# Patient Record
Sex: Male | Born: 1995 | Race: White | Hispanic: No | Marital: Single | Smoking: Former smoker
Health system: Southern US, Community
[De-identification: ages and names within clinical notes are randomized; demographics above are authoritative.]

## PROBLEM LIST (undated history)

## (undated) DIAGNOSIS — F329 Major depressive disorder, single episode, unspecified: Secondary | ICD-10-CM

## (undated) DIAGNOSIS — F32A Depression, unspecified: Secondary | ICD-10-CM

## (undated) DIAGNOSIS — K5792 Diverticulitis of intestine, part unspecified, without perforation or abscess without bleeding: Secondary | ICD-10-CM

## (undated) HISTORY — DX: Depression, unspecified: F32.A

## (undated) HISTORY — DX: Diverticulitis of intestine, part unspecified, without perforation or abscess without bleeding: K57.92

## (undated) HISTORY — DX: Major depressive disorder, single episode, unspecified: F32.9

---
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
## 2016-03-18 ENCOUNTER — Ambulatory Visit: Payer: Medicaid Other | Admitting: Primary Care

## 2016-03-20 ENCOUNTER — Ambulatory Visit: Payer: Self-pay | Admitting: Primary Care

## 2017-03-22 ENCOUNTER — Other Ambulatory Visit: Payer: Self-pay | Admitting: Student

## 2017-03-22 DIAGNOSIS — R1033 Periumbilical pain: Secondary | ICD-10-CM

## 2017-03-24 ENCOUNTER — Ambulatory Visit
Admission: RE | Admit: 2017-03-24 | Discharge: 2017-03-24 | Disposition: A | Discharge status: Home / Self Care | Payer: Managed Care, Other (non HMO) | Source: Ambulatory Visit | Attending: Student | Admitting: Student

## 2017-03-24 DIAGNOSIS — R1033 Periumbilical pain: Secondary | ICD-10-CM

## 2017-03-24 MED ORDER — IOPAMIDOL (ISOVUE-300) INJECTION 61%
100.0000 mL | Freq: Once | INTRAVENOUS | Status: AC | PRN
  Administered 2017-03-24: 100 mL via INTRAVENOUS

## 2018-01-18 ENCOUNTER — Telehealth: Payer: Self-pay | Admitting: General Practice

## 2018-01-18 NOTE — Telephone Encounter (Signed)
Tried calling mom number no answer.  Tried call pt number no voice mail.    What time of insurance does pt have??

## 2018-01-18 NOTE — Telephone Encounter (Signed)
Noted  

## 2018-01-18 NOTE — Telephone Encounter (Signed)
Appointment 8/28  Offered 8/15 appointment mom declined conflict with daughter dentist appintment

## 2018-01-18 NOTE — Telephone Encounter (Signed)
See below CRM Ok to schedule sooner than 9/10  Copied from CRM [id number]. Topic: Appointment Scheduling - Scheduling Inquiry for Clinic >> Jan 18, 2018  8:05 AM [name], [name] wrote: Reason for CRM: mom requesting to see if Tim Ortiz would work pt in asap for a new pt appt? Pt is struggling with depression and the first available is 9/10. Mom wants sooner than that if possible.  Mom sees Tim Ortiz as well.

## 2018-01-18 NOTE — Telephone Encounter (Signed)
We can certainly fit patient in. Can you schedule him at an appointment time that is not back to back with another new patient, hospital follow up, or physical? Any day will work.

## 2018-01-27 ENCOUNTER — Encounter: Payer: Self-pay | Admitting: Primary Care

## 2018-01-27 ENCOUNTER — Ambulatory Visit: Payer: Managed Care, Other (non HMO) | Admitting: Primary Care

## 2018-01-27 VITALS — BP 158/100 | HR 91 | Temp 98.0°F | Ht 67.0 in | Wt 218.5 lb

## 2018-01-27 DIAGNOSIS — F329 Major depressive disorder, single episode, unspecified: Secondary | ICD-10-CM | POA: Insufficient documentation

## 2018-01-27 DIAGNOSIS — F419 Anxiety disorder, unspecified: Secondary | ICD-10-CM | POA: Insufficient documentation

## 2018-01-27 DIAGNOSIS — F411 Generalized anxiety disorder: Secondary | ICD-10-CM | POA: Insufficient documentation

## 2018-01-27 MED ORDER — FLUOXETINE HCL 10 MG PO CAPS: 10.0000 mg | ORAL_CAPSULE | Freq: Every day | ORAL | 1 refills | Status: DC

## 2018-01-27 NOTE — Progress Notes (Signed)
Subjective:    Patient ID: Tim Ortiz, male    DOB: 06-26-1995, 22 y.o.   MRN: [id number]  HPI  Tim Ortiz is a 22 year old male who presents today to establish care and discuss the problems mentioned below. Will obtain old records.  1) Depression: His mother has noticed a change in his mood and thinks he's depressed. He feels little motivation to do anything, doesn't tend to feel any emotion, feels fatigued. Symptoms have been present for the past 2-3 years since the death of his grandmother. He did take a year off from school 2 years ago, quit his job in May this year. He is going back to school this year and is looking forward to this. Overall he feels his symptoms are getting worse. PHQ 9 score 17 and GAD 7 of 10 today. He denies SI/HI  2) Elevated Blood Pressure Reading: On exam today. He denies a prior history of elevated blood pressure readings. He doesn't feel nervous during his exam today. He denies chest pain, headaches, dizziness.   BP Readings from Last 3 Encounters:  01/27/18 (!) 158/100     Review of Systems  Respiratory: Negative for shortness of breath.   Cardiovascular: Negative for chest pain.  Neurological: Negative for dizziness and headaches.  Psychiatric/Behavioral:       See HPI       Past Medical History:  Diagnosis Date  . Depression   . Diverticulitis      Social History   Socioeconomic History  . Marital status: Single    Spouse name: Not on file  . Number of children: Not on file  . Years of education: Not on file  . Highest education level: Not on file  Occupational History  . Not on file  Social Needs  . Financial resource strain: Not on file  . Food insecurity:    Worry: Not on file    Inability: Not on file  . Transportation needs:    Medical: Not on file    Non-medical: Not on file  Tobacco Use  . Smoking status: Former Games developermoker  . Smokeless tobacco: Never Used  Substance and Sexual Activity  . Alcohol use: Yes  . Drug use:  Not on file  . Sexual activity: Not on file  Lifestyle  . Physical activity:    Days per week: Not on file    Minutes per session: Not on file  . Stress: Not on file  Relationships  . Social connections:    Talks on phone: Not on file    Gets together: Not on file    Attends religious service: Not on file    Active member of club or organization: Not on file    Attends meetings of clubs or organizations: Not on file    Relationship status: Not on file  . Intimate partner violence:    Fear of current or ex partner: Not on file    Emotionally abused: Not on file    Physically abused: Not on file    Forced sexual activity: Not on file  Other Topics Concern  . Not on file  Social History Narrative   Single.    No children.   [employer] for [employer].   Enjoys playing video games, spending time with friends.     History reviewed. No pertinent surgical history.  Family History  Problem Relation Age of Onset  . Depression Mother     No Known Allergies  No current outpatient medications  on file prior to visit.   No current facility-administered medications on file prior to visit.     BP (!) 158/100   Pulse 91   Temp 98 F (36.7 C) (Oral)   Ht 5\' 7"  (1.702 m)   Wt 218 lb 8 oz (99.1 kg)   SpO2 97%   BMI 34.22 kg/m    Objective:   Physical Exam  Constitutional: He appears well-nourished.  Cardiovascular: Normal rate and regular rhythm.  Respiratory: Effort normal and breath sounds normal.  Skin: Skin is warm and dry.  Psychiatric: He has a normal mood and affect.           Assessment & Plan:

## 2018-01-27 NOTE — Patient Instructions (Signed)
Start fluoxetine 10 mg capsules once daily for anxiety and depression.  Schedule a follow up visit in 6 weeks for re-evaluation of symptoms.  It was a pleasure to meet you today! Please don't hesitate to call or message me with any questions. Welcome to [facility]!

## 2018-01-27 NOTE — Assessment & Plan Note (Signed)
Mostly symptoms of depression over the last 2-3 years. PHQ 9 score of 17 and GAD 7 score of 10 today. Denies SI/HI.  Discussed treatment options including therapy vs medication, he kindly declines therapy and opts for medication.   We discussed possible side effects of headache, GI upset, drowsiness, and SI/HI. If thoughts of SI/HI develop, we discussed to present to the emergency immediately. Patient verbalized understanding.   Follow up in 6 weeks for re-evaluation.

## 2018-02-09 ENCOUNTER — Ambulatory Visit: Payer: Managed Care, Other (non HMO) | Admitting: Primary Care

## 2018-03-10 ENCOUNTER — Ambulatory Visit: Payer: Managed Care, Other (non HMO) | Admitting: Primary Care

## 2018-03-10 ENCOUNTER — Encounter: Payer: Self-pay | Admitting: Primary Care

## 2018-03-10 DIAGNOSIS — F419 Anxiety disorder, unspecified: Secondary | ICD-10-CM | POA: Diagnosis not present

## 2018-03-10 DIAGNOSIS — R030 Elevated blood-pressure reading, without diagnosis of hypertension: Secondary | ICD-10-CM | POA: Diagnosis not present

## 2018-03-10 DIAGNOSIS — F329 Major depressive disorder, single episode, unspecified: Secondary | ICD-10-CM | POA: Diagnosis not present

## 2018-03-10 MED ORDER — FLUOXETINE HCL 10 MG PO CAPS: 10.0000 mg | ORAL_CAPSULE | Freq: Every day | ORAL | 2 refills | Status: DC

## 2018-03-10 NOTE — Assessment & Plan Note (Signed)
Improved on fluoxetine 10 mg. PHQ 9 score of 8 today. Denies SI/HI.  Continue same. Refills sent to pharmacy.

## 2018-03-10 NOTE — Assessment & Plan Note (Addendum)
Above goal today, also on last visit but is improved. Will have him continue to work on regular exercise and work on diet. BP improved on recheck to 136/96.  Will have him start monitoring BP at home and report readings at or above 135/90 after a few months of dietary and lifestyle changes. Continue to monitor.

## 2018-03-10 NOTE — Progress Notes (Signed)
Subjective:    Patient ID: Tim Ortiz, male    DOB: 10/19/1995, 22 y.o.   MRN: [id number]  HPI  Tim Ortiz is a 22 year old male who presents today for follow up of anxiety and depression.  He was last evaluated in mid August 2019 as a new patient with a 2-3 year history of symptoms of mood changes, little motivation to do anything, not feeling emotion, fatigue. PHQ 9 score of 17 and GAD 7 score of 10 that day. He had never been treated so he opted for medication and was initiated on Fluoxetine 10 mg.   Since his last visit he's noticed improvement. Positive effects include more motivation to do things, he's started running, decreased fatigue, more patience with his younger sister. He denies nausea, GI upset, headaches, SI/HI. PHQ 9 score of 8. He does still have difficulty falling asleep and is taking Melatonin.   2) Elevated Blood Pressure: Above goal today, also on prior visit. He does have a family history of hypertension. He is now feeling more motivated to exercise and improve his diet. He does not check his BP at home. He denies dizziness, chest pain, shortness of breath.   BP Readings from Last 3 Encounters:  03/10/18 (!) 144/100  01/27/18 (!) 158/100     Review of Systems  Eyes: Negative for visual disturbance.  Respiratory: Negative for shortness of breath.   Cardiovascular: Negative for chest pain.  Neurological: Negative for dizziness and headaches.  Psychiatric/Behavioral: Negative for suicidal ideas.       See HPI       Past Medical History:  Diagnosis Date  . Depression   . Diverticulitis      Social History   Socioeconomic History  . Marital status: Single    Spouse name: Not on file  . Number of children: Not on file  . Years of education: Not on file  . Highest education level: Not on file  Occupational History  . Not on file  Social Needs  . Financial resource strain: Not on file  . Food insecurity:    Worry: Not on file    Inability: Not on  file  . Transportation needs:    Medical: Not on file    Non-medical: Not on file  Tobacco Use  . Smoking status: Former Games developer  . Smokeless tobacco: Never Used  Substance and Sexual Activity  . Alcohol use: Yes  . Drug use: Not on file  . Sexual activity: Not on file  Lifestyle  . Physical activity:    Days per week: Not on file    Minutes per session: Not on file  . Stress: Not on file  Relationships  . Social connections:    Talks on phone: Not on file    Gets together: Not on file    Attends religious service: Not on file    Active member of club or organization: Not on file    Attends meetings of clubs or organizations: Not on file    Relationship status: Not on file  . Intimate partner violence:    Fear of current or ex partner: Not on file    Emotionally abused: Not on file    Physically abused: Not on file    Forced sexual activity: Not on file  Other Topics Concern  . Not on file  Social History Narrative   Single.    No children.   [employer].   Enjoys playing video games, spending  time with friends.     No past surgical history on file.  Family History  Problem Relation Age of Onset  . Depression Mother     No Known Allergies  Current Outpatient Medications on File Prior to Visit  Medication Sig Dispense Refill  . FLUoxetine (PROZAC) 10 MG capsule Take 1 capsule (10 mg total) by mouth daily. 30 capsule 1   No current facility-administered medications on file prior to visit.     BP (!) 144/100   Pulse 67   Temp 98.4 F (36.9 C) (Oral)   Ht 5\' 7"  (1.702 m)   Wt 210 lb (95.3 kg)   SpO2 98%   BMI 32.89 kg/m    Objective:   Physical Exam  Constitutional: He appears well-nourished.  Neck: Neck supple.  Cardiovascular: Normal rate and regular rhythm.  Respiratory: Effort normal and breath sounds normal.  Skin: Skin is warm and dry.  Psychiatric: He has a normal mood and affect.           Assessment & Plan:

## 2018-03-10 NOTE — Patient Instructions (Signed)
Continue fluoxetine 10 mg daily for anxiety and depression.  Start monitoring your blood pressure 1-2 times weekly, around the same time of day, for the next several months.  Ensure that you have rested for 30 minutes prior to checking your blood pressure. Record your readings and notify me of consistent readings at or above 135/90.  It was a pleasure to see you today!

## 2018-06-30 ENCOUNTER — Encounter: Payer: Self-pay | Admitting: Primary Care

## 2018-06-30 ENCOUNTER — Telehealth: Payer: Self-pay | Admitting: Primary Care

## 2018-06-30 ENCOUNTER — Ambulatory Visit: Payer: Managed Care, Other (non HMO) | Admitting: Primary Care

## 2018-06-30 VITALS — BP 122/78 | HR 77 | Temp 98.0°F | Ht 67.0 in | Wt 200.2 lb

## 2018-06-30 DIAGNOSIS — F419 Anxiety disorder, unspecified: Secondary | ICD-10-CM | POA: Diagnosis not present

## 2018-06-30 DIAGNOSIS — F329 Major depressive disorder, single episode, unspecified: Secondary | ICD-10-CM | POA: Diagnosis not present

## 2018-06-30 MED ORDER — FLUOXETINE HCL 20 MG PO TABS: 20.0000 mg | ORAL_TABLET | Freq: Every day | ORAL | 1 refills | Status: DC

## 2018-06-30 NOTE — Telephone Encounter (Signed)
Need to verify dosage of Fluoxetine 20mg . Please call.

## 2018-06-30 NOTE — Patient Instructions (Signed)
We've increased the dose of your fluoxetine to 20 mg. You may take two of the fluoxetine 10 mg tablets to equal 20 mg until your bottle is empty.  Schedule a follow up visit for 6 weeks for re-evaluation of depression.  It was a pleasure to see you today!

## 2018-06-30 NOTE — Assessment & Plan Note (Signed)
PHQ 9 score of 12 today, does seem as though his fluoxetine isn't as effective. Given that he's had some improvement we will increase his dose to 20 mg. Denies SI/HI. Follow up in 6 weeks for re-evaluation.

## 2018-06-30 NOTE — Progress Notes (Signed)
Subjective:    Patient ID: Tim Ortiz, male    DOB: 12-23-1995, 23 y.o.   MRN: [id number]  HPI  Tim Ortiz is a 23 year old male with a history of anxiety and depression who presents today to discuss medication treatment.  He is currently managed on fluoxetine 10 mg which was initiated in mid August 2019. GAD 7 score of 10 and PHQ 9 score of 17 that day. He returned for a follow up visit in late September and endorsed improvement so his medication was continued.  Since his last visit he feels like it "doesn't work anymore". Symptoms include feeling "numb". He does look forward to seeing friends. Little motivation to do anything, feels as though life is boring. He denies SI/HI.   BP Readings from Last 3 Encounters:  06/30/18 122/78  03/10/18 (!) 136/96  01/27/18 (!) 158/100     Review of Systems  Respiratory: Negative for shortness of breath.   Cardiovascular: Negative for chest pain.  Psychiatric/Behavioral: Positive for sleep disturbance. Negative for suicidal ideas. The patient is not nervous/anxious.        See HPI       Past Medical History:  Diagnosis Date  . Depression   . Diverticulitis      Social History   Socioeconomic History  . Marital status: Single    Spouse name: Not on file  . Number of children: Not on file  . Years of education: Not on file  . Highest education level: Not on file  Occupational History  . Not on file  Social Needs  . Financial resource strain: Not on file  . Food insecurity:    Worry: Not on file    Inability: Not on file  . Transportation needs:    Medical: Not on file    Non-medical: Not on file  Tobacco Use  . Smoking status: Former Games developer  . Smokeless tobacco: Never Used  Substance and Sexual Activity  . Alcohol use: Yes  . Drug use: Not on file  . Sexual activity: Not on file  Lifestyle  . Physical activity:    Days per week: Not on file    Minutes per session: Not on file  . Stress: Not on file  Relationships   . Social connections:    Talks on phone: Not on file    Gets together: Not on file    Attends religious service: Not on file    Active member of club or organization: Not on file    Attends meetings of clubs or organizations: Not on file    Relationship status: Not on file  . Intimate partner violence:    Fear of current or ex partner: Not on file    Emotionally abused: Not on file    Physically abused: Not on file    Forced sexual activity: Not on file  Other Topics Concern  . Not on file  Social History Narrative   Single.    No children.   [employer].   Enjoys playing video games, spending time with friends.     No past surgical history on file.  Family History  Problem Relation Age of Onset  . Depression Mother     No Known Allergies  No current outpatient medications on file prior to visit.   No current facility-administered medications on file prior to visit.     BP 122/78 (BP Location: Left Arm, Patient Position: Sitting, Cuff Size: Normal)   Pulse 77  Temp 98 F (36.7 C) (Oral)   Ht 5\' 7"  (1.702 m)   Wt 200 lb 4 oz (90.8 kg)   SpO2 98%   BMI 31.36 kg/m    Objective:   Physical Exam  Constitutional: He appears well-nourished.  Cardiovascular: Normal rate and regular rhythm.  Respiratory: Effort normal and breath sounds normal.  Skin: Skin is warm and dry.  Psychiatric: He has a normal mood and affect.  Appears depressed, overall good eye contact. Not wanting to contribute much to HPI           Assessment & Plan:

## 2018-06-30 NOTE — Telephone Encounter (Signed)
Verified the dose change with pharmacist.

## 2018-08-11 ENCOUNTER — Ambulatory Visit: Payer: Managed Care, Other (non HMO) | Admitting: Primary Care

## 2018-08-11 ENCOUNTER — Encounter: Payer: Self-pay | Admitting: Primary Care

## 2018-08-11 DIAGNOSIS — F329 Major depressive disorder, single episode, unspecified: Secondary | ICD-10-CM | POA: Diagnosis not present

## 2018-08-11 DIAGNOSIS — F419 Anxiety disorder, unspecified: Secondary | ICD-10-CM

## 2018-08-11 MED ORDER — FLUOXETINE HCL 40 MG PO CAPS: 40.0000 mg | ORAL_CAPSULE | Freq: Every day | ORAL | 0 refills | Status: DC

## 2018-08-11 NOTE — Progress Notes (Signed)
Subjective:    Patient ID: Tim Ortiz, male    DOB: 10-Apr-1996, 23 y.o.   MRN: [id number]  HPI  Tim Ortiz is a 23 year old male who presents today for follow up of anxiety and depression.  He was last evaluated on 06/30/18 with reports of return of depression with symptoms of feeling "numb", little motivation to do anything, not wanting to spend time with friends. He was managed on fluoxetine which was initiated in August 2019, felt like it was no longer effective. During his last visit PHQ 9 score of 12, fluoxetine increased to 20 mg.   Since his last visit he's feeling slightly improved, some good days. He doesn't dread the next day as much as he once did. He can't pinpoint positive effects but has noticed improvement. He is not seeing therapy as he has declined on prior visits. He denies SI/HI, GI upset.   Review of Systems  Cardiovascular: Negative for chest pain.  Gastrointestinal: Negative for nausea.  Psychiatric/Behavioral: Negative for suicidal ideas.       See HPI       Past Medical History:  Diagnosis Date  . Depression   . Diverticulitis      Social History   Socioeconomic History  . Marital status: Single    Spouse name: Not on file  . Number of children: Not on file  . Years of education: Not on file  . Highest education level: Not on file  Occupational History  . Not on file  Social Needs  . Financial resource strain: Not on file  . Food insecurity:    Worry: Not on file    Inability: Not on file  . Transportation needs:    Medical: Not on file    Non-medical: Not on file  Tobacco Use  . Smoking status: Former Games developer  . Smokeless tobacco: Never Used  Substance and Sexual Activity  . Alcohol use: Yes  . Drug use: Not on file  . Sexual activity: Not on file  Lifestyle  . Physical activity:    Days per week: Not on file    Minutes per session: Not on file  . Stress: Not on file  Relationships  . Social connections:    Talks on phone: Not on  file    Gets together: Not on file    Attends religious service: Not on file    Active member of club or organization: Not on file    Attends meetings of clubs or organizations: Not on file    Relationship status: Not on file  . Intimate partner violence:    Fear of current or ex partner: Not on file    Emotionally abused: Not on file    Physically abused: Not on file    Forced sexual activity: Not on file  Other Topics Concern  . Not on file  Social History Narrative   Single.    No children.   [employer].   Enjoys playing video games, spending time with friends.     No past surgical history on file.  Family History  Problem Relation Age of Onset  . Depression Mother     No Known Allergies  Current Outpatient Medications on File Prior to Visit  Medication Sig Dispense Refill  . FLUoxetine (PROZAC) 20 MG tablet Take 1 tablet (20 mg total) by mouth daily. For depression. 90 tablet 1   No current facility-administered medications on file prior to visit.  BP (!) 136/92   Pulse 74   Temp 98.1 F (36.7 C) (Oral)   Ht 5\' 7"  (1.702 m)   Wt 197 lb (89.4 kg)   SpO2 98%   BMI 30.85 kg/m    Objective:   Physical Exam  Constitutional: He appears well-nourished.  Neck: Neck supple.  Cardiovascular: Normal rate and regular rhythm.  Respiratory: Effort normal and breath sounds normal.  Skin: Skin is warm and dry.  Psychiatric: He has a normal mood and affect.           Assessment & Plan:

## 2018-08-11 NOTE — Assessment & Plan Note (Signed)
Seems improved when compared to prior visit, not at goal. Increase fluoxetine to 40 mg daily.  Also strongly recommended therapy for which he continues to decline. He will think about this and update.  We will plan to see him back in 4-6 weeks for follow up.

## 2018-08-11 NOTE — Patient Instructions (Signed)
We've increased the dose of your fluoxetine to 40 mg. You may take two of the 20 mg tablets until your bottle is empty.  Please consider seeing the counselor as discussed.   Schedule a follow up visit for 6 weeks for re-evaluation.  It was a pleasure to see you today!

## 2018-09-27 ENCOUNTER — Telehealth: Payer: Self-pay | Admitting: Primary Care

## 2018-09-27 NOTE — Telephone Encounter (Signed)
Called to see if patient has capability to do virtual visit on 4/16 instead of coming into office. No answer and no voicemail set up.

## 2018-09-29 ENCOUNTER — Ambulatory Visit: Payer: Managed Care, Other (non HMO) | Admitting: Primary Care

## 2018-10-14 ENCOUNTER — Telehealth: Payer: Self-pay | Admitting: Primary Care

## 2018-10-14 NOTE — Telephone Encounter (Signed)
Patient due for follow up visit for anxiety and depression, please schedule virtual visit.

## 2018-10-14 NOTE — Telephone Encounter (Signed)
Called to schedule. No answer and no voicemail set up.

## 2018-11-16 ENCOUNTER — Encounter: Payer: Self-pay | Admitting: Primary Care

## 2018-11-16 ENCOUNTER — Ambulatory Visit (INDEPENDENT_AMBULATORY_CARE_PROVIDER_SITE_OTHER): Payer: Managed Care, Other (non HMO) | Admitting: Primary Care

## 2018-11-16 VITALS — Wt 197.0 lb

## 2018-11-16 DIAGNOSIS — F329 Major depressive disorder, single episode, unspecified: Secondary | ICD-10-CM

## 2018-11-16 DIAGNOSIS — F419 Anxiety disorder, unspecified: Secondary | ICD-10-CM

## 2018-11-16 DIAGNOSIS — F32A Depression, unspecified: Secondary | ICD-10-CM

## 2018-11-16 MED ORDER — ESCITALOPRAM OXALATE 10 MG PO TABS: 10.0000 mg | ORAL_TABLET | Freq: Every day | ORAL | 1 refills | Status: DC

## 2018-11-16 NOTE — Assessment & Plan Note (Signed)
No real improvement on increased dose of fluoxetine 40 mg, overall does feel somewhat better but he may do better on another treatment regimen. Will have him switch from fluoxetine to Lexapro 10 mg.  Discussed instructions for switching. We will plan to see him back in 4-6 weeks for follow up. He will call sooner if he has any problems.

## 2018-11-16 NOTE — Patient Instructions (Signed)
  Switch from fluoxetine to escitalopram (Lexapro) 10 mg tablets for anxiety and depression.  Schedule a follow up visit for 4-6 weeks. Call me sooner if you have any problems.  It was a pleasure to see you today! [name], NP-C

## 2018-11-16 NOTE — Progress Notes (Signed)
Subjective:    Patient ID: Tim Ortiz, male    DOB: 1995/11/25, 23 y.o.   MRN: [id number]  HPI  Virtual Visit via Video Note  I connected with [name] on 11/16/18 at  2:40 PM EDT by a video enabled telemedicine application and verified that I am speaking with the correct person using two identifiers.  Location: Patient: Home Provider: Office   I discussed the limitations of evaluation and management by telemedicine and the availability of in person appointments. The patient expressed understanding and agreed to proceed.  History of Present Illness:  [name] is a 23 year old male who presents today for follow up of depression and anxiety.   He was last evaluated on 08/11/18 for same. During that visit his fluoxetine was increased from 20 mg to 40 mg as he had noticed some improvement overall.   Since his last visit he's feeling slightly better as he has been getting out of his house and is exercising. He hasn't noticed a difference since we increased the dose of fluoxetine to 40 mg. He thinks he's feeling slightly better as he's getting out a little more, not because of the dose increase. He's not tried any other medication for his symptoms. Denies SI/HI.    Observations/Objective:  Alert and oriented. Appears well, not sickly. No distress. Speaking in complete sentences.   Assessment and Plan:  No real improvement on increased dose of fluoxetine 40 mg, overall does feel somewhat better but he may do better on another treatment regimen. Will have him switch from fluoxetine to Lexapro 10 mg.  Discussed instructions for switching. We will plan to see him back in 4-6 weeks for follow up. He will call sooner if he has any problems.   Follow Up Instructions:  Switch from fluoxetine to escitalopram (Lexapro) 10 mg tablets for anxiety and depression.  Schedule a follow up visit for 4-6 weeks. Call me sooner if you have any problems.  It was a pleasure to see you  today! [name], NP-C    I discussed the assessment and treatment plan with the patient. The patient was provided an opportunity to ask questions and all were answered. The patient agreed with the plan and demonstrated an understanding of the instructions.   The patient was advised to call back or seek an in-person evaluation if the symptoms worsen or if the condition fails to improve as anticipated.     [name], NP    Review of Systems  Respiratory: Negative for shortness of breath.   Cardiovascular: Negative for chest pain.  Psychiatric/Behavioral:       See HPI       Past Medical History:  Diagnosis Date  . Depression   . Diverticulitis      Social History   Socioeconomic History  . Marital status: Single    Spouse name: Not on file  . Number of children: Not on file  . Years of education: Not on file  . Highest education level: Not on file  Occupational History  . Not on file  Social Needs  . Financial resource strain: Not on file  . Food insecurity:    Worry: Not on file    Inability: Not on file  . Transportation needs:    Medical: Not on file    Non-medical: Not on file  Tobacco Use  . Smoking status: Former Games developer  . Smokeless tobacco: Never Used  Substance and Sexual Activity  . Alcohol  use: Yes  . Drug use: Not on file  . Sexual activity: Not on file  Lifestyle  . Physical activity:    Days per week: Not on file    Minutes per session: Not on file  . Stress: Not on file  Relationships  . Social connections:    Talks on phone: Not on file    Gets together: Not on file    Attends religious service: Not on file    Active member of club or organization: Not on file    Attends meetings of clubs or organizations: Not on file    Relationship status: Not on file  . Intimate partner violence:    Fear of current or ex partner: Not on file    Emotionally abused: Not on file    Physically abused: Not on file    Forced sexual activity:  Not on file  Other Topics Concern  . Not on file  Social History Narrative   Single.    No children.   [employer] for [employer].   Enjoys playing video games, spending time with friends.     No past surgical history on file.  Family History  Problem Relation Age of Onset  . Depression Mother     No Known Allergies  Current Outpatient Medications on File Prior to Visit  Medication Sig Dispense Refill  . FLUoxetine (PROZAC) 40 MG capsule Take 1 capsule (40 mg total) by mouth daily. For depression and anxiety. (Patient not taking: Reported on 11/16/2018) 90 capsule 0   No current facility-administered medications on file prior to visit.     Wt 197 lb (89.4 kg)   BMI 30.85 kg/m    Objective:   Physical Exam  Constitutional: He is oriented to person, place, and time. He appears well-nourished.  Respiratory: Effort normal.  Neurological: He is alert and oriented to person, place, and time.  Psychiatric: He has a normal mood and affect.           Assessment & Plan:

## 2018-12-15 ENCOUNTER — Other Ambulatory Visit: Payer: Self-pay | Admitting: Primary Care

## 2018-12-15 DIAGNOSIS — F329 Major depressive disorder, single episode, unspecified: Secondary | ICD-10-CM

## 2018-12-15 DIAGNOSIS — F419 Anxiety disorder, unspecified: Secondary | ICD-10-CM

## 2019-03-15 ENCOUNTER — Other Ambulatory Visit: Payer: Self-pay | Admitting: Primary Care

## 2019-03-15 DIAGNOSIS — F329 Major depressive disorder, single episode, unspecified: Secondary | ICD-10-CM

## 2019-03-15 DIAGNOSIS — F419 Anxiety disorder, unspecified: Secondary | ICD-10-CM

## 2020-06-27 ENCOUNTER — Ambulatory Visit (HOSPITAL_COMMUNITY)
Admission: EM | Admit: 2020-06-27 | Discharge: 2020-06-27 | Disposition: A | Discharge status: Home / Self Care | Payer: 59

## 2020-06-27 ENCOUNTER — Encounter (HOSPITAL_COMMUNITY): Payer: Self-pay | Admitting: Emergency Medicine

## 2020-06-27 ENCOUNTER — Other Ambulatory Visit: Payer: Self-pay

## 2020-06-27 DIAGNOSIS — F32A Depression, unspecified: Secondary | ICD-10-CM | POA: Diagnosis not present

## 2020-06-27 DIAGNOSIS — F419 Anxiety disorder, unspecified: Secondary | ICD-10-CM | POA: Diagnosis not present

## 2020-06-27 DIAGNOSIS — F411 Generalized anxiety disorder: Secondary | ICD-10-CM

## 2020-06-27 DIAGNOSIS — F339 Major depressive disorder, recurrent, unspecified: Secondary | ICD-10-CM | POA: Insufficient documentation

## 2020-06-27 NOTE — ED Provider Notes (Signed)
Behavioral Health Urgent Care Medical Screening Exam  Patient Name: Tim Ortiz MRN: [id number] Date of Evaluation: 06/27/20 Chief Complaint: Chief Complaint/Presenting Problem: Tim Ortiz is a 25 yo male presenting as a walk in to [facility] for evaluation of depression symptoms.  Pt reported that he has SI thoughts initially on intake, but denied when asked directly. Pt reports that he has been treated for depression in the past, and has tried two different medications, but was not consistent with them. Pt was recommended to schedule individual counseling, but pt stated that he did not want to follow through with that at the time. Pt denies any HI or AVH at time of assessment. Pt does not express paranoid ideation, and has coherent thought process. Pt presents with a very flat, sad affect.  Pt reports social etoh consumption and regular marijuana use. Pt states that he feels the marijuana helps him manage his anxiety, so he is resistant to stop using it. Berneice Heinrich, NP got collateral information from parents and pts parents do not feel that he is a danger to himself at time of assessment. Pt will be provided outpatient psychiatric resources. Diagnosis:  Final diagnoses:  Anxiety and depression    History of Present illness: Tim Ortiz is a 25 y.o. male.  Patient presents voluntarily to [facility] for walk-in assessment.  Patient reports recent stressors include "every day being absurdly miserable for 4 years and tired of feeling the way I feel and I want to get on with my life."  Patient resides in [place] with his parents and 3 sisters.  Patient denies access to weapons.  Patient is currently not employed and does not attend school, patient reports this is a stressor as he should have "gotten farther in life."  Patient denies alcohol use.  Patient endorses substance use, marijuana approximately once per week.  Patient denies substance use aside from marijuana.  Patient  endorses average sleep and decreased appetite.  Patient reports he is currently seen by primary care and prescribed antidepressant medications.  Patient was diagnosed with anxiety and depression 2 years ago by primary care provider.  Patient reports he understands that antidepressant medications have been effective, patient reports mother has history of depression and is much improved taking medication.  Patient reports "I feel like the medication makes me feel empty."  Patient denies outpatient psychiatry, patient denies counseling at this time.  Patient is open to follow-up with outpatient psychiatry and outpatient counseling.  Patient assessed by nurse practitioner.  Patient alert and oriented, answers appropriately.  Patient pleasant cooperative during assessment.  Patient denies suicidal ideations.  Patient denies any history of suicide attempts, denies any history of self-harm behaviors.  Patient denies homicidal ideations.  There is no evidence of delusional thought content and no indication that patient is responding to internal stimuli.  Patient denies both auditory and visual hallucinations.  Patient denies symptoms of paranoia.  Patient offered support and encouragement.  Patient gives verbal consent to speak with his parents, [place] and Greggory Stallion in the lobby.  Patient's parents deny concern for patient's safety.  Patient's mother reports patient began feeling depressed approximately 5 years ago when his maternal grandmother passed away.  Patient's mother reports she has schedule an appointment for patient to follow-up with primary care provider tomorrow and she will assist patient in establishing follow-up with outpatient psychiatry.   Psychiatric Specialty Exam  Presentation  General Appearance:Appropriate for Environment; Casual  Eye Contact:Good  Speech:Clear and Coherent; Normal Rate  Speech Volume:Normal  Handedness:Right   Mood and Affect   Mood:Depressed  Affect:Appropriate; Congruent   Thought Process  Thought Processes:Coherent; Goal Directed  Descriptions of Associations:Intact  Orientation:Full (Time, Place and Person)  Thought Content:Logical  Hallucinations:None  Ideas of Reference:None  Suicidal Thoughts:No  Homicidal Thoughts:No   Sensorium  Memory:Immediate Good; Recent Good; Remote Good  Judgment:Good  Insight:Good   Executive Functions  Concentration:Good  Attention Span:Good  Recall:Good  Fund of Knowledge:Good  Language:Good   Psychomotor Activity  Psychomotor Activity:Normal   Assets  Assets:Communication Skills; Desire for Improvement; Financial Resources/Insurance; Housing; Intimacy; Leisure Time; Physical Health; Resilience; Transportation; Talents/Skills; Social Support   Sleep  Sleep:Fair  Number of hours: No data recorded  Physical Exam: Physical Exam Vitals and nursing note reviewed.  Constitutional:      Appearance: He is well-developed.  HENT:     Head: Normocephalic.  Cardiovascular:     Rate and Rhythm: Normal rate.  Pulmonary:     Effort: Pulmonary effort is normal.  Neurological:     Mental Status: He is alert and oriented to person, place, and time.  Psychiatric:        Attention and Perception: Attention and perception normal.        Mood and Affect: Affect normal. Mood is depressed.        Speech: Speech normal.        Behavior: Behavior normal. Behavior is cooperative.        Thought Content: Thought content normal.        Cognition and Memory: Cognition and memory normal.        Judgment: Judgment normal.    Review of Systems  Constitutional: Negative.   HENT: Negative.   Eyes: Negative.   Respiratory: Negative.   Cardiovascular: Negative.   Gastrointestinal: Negative.   Genitourinary: Negative.   Musculoskeletal: Negative.   Skin: Negative.   Neurological: Negative.   Endo/Heme/Allergies: Negative.   Psychiatric/Behavioral:  Positive for depression and substance abuse.   Pulse 75, temperature 98.2 F (36.8 C), temperature source Oral, resp. rate 18, height 5\' 7"  (1.702 m), weight 187 lb (84.8 kg), SpO2 100 %. Body mass index is 29.29 kg/m.  Musculoskeletal: Strength & Muscle Tone: within normal limits Gait & Station: normal Patient leans: N/A   [facility] MSE Discharge Disposition for Follow up and Recommendations: Based on my evaluation the patient does not appear to have an emergency medical condition and can be discharged with resources and follow up care in outpatient services for Medication Management and Individual Therapy  Outpatient psychiatry resource options provided. Patient reviewed with Dr. .   Nelly Rout, FNP 06/27/2020, 7:08 PM

## 2020-06-27 NOTE — Discharge Instructions (Addendum)

## 2020-06-27 NOTE — ED Triage Notes (Signed)
Pt presents to [facility] requesting a psychiatric evaluation. Pt states "I can't find the motivation to do anything." Pt denies SI/HI, AVH. Pt reports he takes no medication and is not followed by a psychiatrist or therapist.

## 2020-06-27 NOTE — BH Assessment (Signed)
Comprehensive Clinical Assessment (CCA) Note  06/27/2020 Tim Ortiz [id number]  Chief Complaint:  Chief Complaint  Patient presents with  . Depression   Visit Diagnosis:   Depression Generalized Anxiety Disorder  Tim Ortiz is a 25 yo male presenting as a walk in to [facility] for evaluation of depression symptoms. Pt reported that he has SI thoughts initially on intake, but denied when asked directly. Pt reports that he has been treated for depression in the past, and has tried two different medications, but was not consistent with them. Pt was recommended to schedule individual counseling, but pt stated that he did not want to follow through with that at the time. Pt denies any HI or AVH at time of assessment. Pt does not express paranoid ideation, and has coherent thought process. Pt presents with a very flat, sad affect. Pt reports social etoh consumption and regular marijuana use. Pt states that he feels the marijuana helps him manage his anxiety, so he is resistant to stop using it. Berneice Heinrich, NP got collateral information from parents and pts parents do not feel that he is a danger to himself at time of assessment. Pt will be provided outpatient psychiatric resources  Weyman Pedro, MSW, LCSW Outpatient Therapist/Triage Specialist  Disposition: Per Berneice Heinrich, NP pt to be discharged with outpatient resources   CCA Screening, Triage and Referral (STR)  Patient Reported Information How did you hear about [place]? Family/Friend (Phreesia 06/27/2020)  Referral name: Na (Phreesia 06/27/2020)  Referral phone number: No data recorded  Whom do you see for routine medical problems? Primary Care (Phreesia 06/27/2020)  Practice/Facility Name: [facility] (Phreesia 06/27/2020)  Practice/Facility Phone Number: No data recorded Name of Contact: Signa (Phreesia 06/27/2020)  Contact Number: [phone] (Phreesia 06/27/2020)  Contact Fax Number: [phone] (Phreesia 06/27/2020)  Prescriber  Name: Garfield Coiner (Phreesia 06/27/2020)  Prescriber Address (if known): No data recorded  What Is the Reason for Your Visit/Call Today? check Up (Phreesia 06/27/2020)  How Long Has This Been Causing You Problems? > than 6 months (Phreesia 06/27/2020)  What Do You Feel Would Help You the Most Today? Assessment Only (Phreesia 06/27/2020)   Have You Recently Been in Any Inpatient Treatment (Hospital/Detox/Crisis Center/28-Day Program)? No (Phreesia 06/27/2020)  Name/Location of Program/Hospital:No data recorded How Long Were You There? No data recorded When Were You Discharged? No data recorded  Have You Ever Received Services From [facility] Before? No (Phreesia 06/27/2020)  Who Do You See at [facility]? No data recorded  Have You Recently Had Any Thoughts About Hurting Yourself? Yes (Phreesia 06/27/2020)  Are You Planning to Commit Suicide/Harm Yourself At This time? No (Phreesia 06/27/2020)   Have you Recently Had Thoughts About Hurting Someone Karolee Ohs? No (Phreesia 06/27/2020)  Explanation: No data recorded  Have You Used Any Alcohol or Drugs in the Past 24 Hours? No (Phreesia 06/27/2020)  How Long Ago Did You Use Drugs or Alcohol? No data recorded What Did You Use and How Much? No data recorded  Do You Currently Have a Therapist/Psychiatrist? No (Phreesia 06/27/2020)  Name of Therapist/Psychiatrist: No data recorded  Have You Been Recently Discharged From Any Office Practice or Programs? No (Phreesia 06/27/2020)  Explanation of Discharge From Practice/Program: No data recorded    CCA Screening Triage Referral Assessment Type of Contact: Face-to-Face  Is this Initial or Reassessment? No data recorded Date Telepsych consult ordered in [facility]:  No data recorded Time Telepsych consult ordered in [facility]:  No data recorded  Patient Reported Information Reviewed? Yes  Patient Left Without Being Seen? No data recorded Reason for Not Completing Assessment: No data  recorded  Collateral Involvement: No data recorded  Does Patient Have a Court Appointed Legal Guardian? No data recorded Name and Contact of Legal Guardian: No data recorded If Minor and Not Living with Parent(s), Who has Custody? No data recorded Is CPS involved or ever been involved? Never  Is APS involved or ever been involved? Never   Patient Determined To Be At Risk for Harm To Self or Others Based on Review of Patient Reported Information or Presenting Complaint? No  Method: No data recorded Availability of Means: No data recorded Intent: No data recorded Notification Required: No data recorded Additional Information for Danger to Others Potential: No data recorded Additional Comments for Danger to Others Potential: No data recorded Are There Guns or Other Weapons in Your Home? No data recorded Types of Guns/Weapons: No data recorded Are These Weapons Safely Secured?                            No data recorded Who Could Verify You Are Able To Have These Secured: No data recorded Do You Have any Outstanding Charges, Pending Court Dates, Parole/Probation? No data recorded Contacted To Inform of Risk of Harm To Self or Others: No data recorded  [facility]   Does Patient Present under Involuntary Commitment? No  IVC Papers Initial File Date: No data recorded  [place]   Patient Currently Receiving the Following Services: Not Receiving Services   Determination of Need: No data recorded  Options For Referral: Medication Management; Outpatient Therapy     CCA Biopsychosocial Intake/Chief Complaint:  Tim Ortiz is a 25 yo male [facility]  Pt reported that he has SI thoughts initially on intake, but denied when asked directly. Pt reports that he has been treated for depression in the past, and has tried two different medications, but was not consistent with  them. Pt was recommended to schedule individual counseling, but pt stated that he did not want to follow through with that at the time. Pt denies any HI or AVH at time of assessment. Pt does not express paranoid ideation, and has coherent thought process. Pt presents with a very flat, sad affect.  Pt reports social etoh consumption and regular marijuana use. Pt states that he feels the marijuana helps him manage his anxiety, so he is resistant to stop using it. Berneice Heinrichina Tate, NP got collateral information from parents and pts parents do not feel that he is a danger to himself at time of assessment. Pt will be provided outpatient psychiatric resources.  Current Symptoms/Problems: depression, passive suicidal thoughts   Patient Reported Schizophrenia/Schizoaffective Diagnosis in Past: No   Strengths: good family support; good physical health  Preferences: outpatient resources  Abilities: No data recorded  Type of Services Patient Feels are Needed: psychiatric support   Initial Clinical Notes/Concerns: No data recorded  Mental Health Symptoms Depression:  Change in energy/activity; Difficulty Concentrating; Tearfulness; Fatigue; Weight gain/loss; Hopelessness; Worthlessness; Increase/decrease in appetite; Irritability   Duration of Depressive symptoms: Greater than two weeks   Mania:  Racing thoughts   Anxiety:   Difficulty concentrating; Irritability; Worrying   Psychosis:  Affective flattening/alogia/avolition (no AVH)   Duration of Psychotic symptoms: No data recorded  Trauma:  Detachment from others   Obsessions:  N/A  Compulsions:  None   Inattention:  None   Hyperactivity/Impulsivity:  N/A   Oppositional/Defiant Behaviors:  None   Emotional Irregularity:  None   Other Mood/Personality Symptoms:  No data recorded   Mental Status Exam Appearance and self-care  Stature:  Average   Weight:  Average weight   Clothing:  No data recorded  Grooming:  Normal   Cosmetic  use:  None   Posture/gait:  Normal   Motor activity:  Not Remarkable   Sensorium  Attention:  Normal   Concentration:  Normal   Orientation:  X5   Recall/memory:  Normal   Affect and Mood  Affect:  Depressed; Flat   Mood:  Depressed   Relating  Eye contact:  Normal   Facial expression:  Depressed   Attitude toward examiner:  Cooperative   Thought and Language  Speech flow: Clear and Coherent   Thought content:  Appropriate to Mood and Circumstances   Preoccupation:  None   Hallucinations:  None   Organization:  No data recorded  [employer] of Knowledge:  Good   Intelligence:  Average   Abstraction:  Functional   Judgement:  Fair   Reality Testing:  Realistic   Insight:  Flashes of insight   Decision Making:  Normal   Social Functioning  Social Maturity:  Isolates   Social Judgement:  Normal   Stress  Stressors:  Other (Comment) (pt feeling "stuck")   Coping Ability:  Deficient supports; Overwhelmed   Skill Deficits:  None   Supports:  Family     Religion:    Leisure/Recreation: Leisure / Recreation Do You Have Hobbies?: Yes Leisure and Hobbies: video games  Exercise/Diet: Exercise/Diet Do You Exercise?: No Have You Gained or Lost A Significant Amount of Weight in the Past Six Months?: Yes-Lost Do You Follow a Special Diet?: No Do You Have Any Trouble Sleeping?: No   CCA Employment/Education Employment/Work Situation: Employment / Work Situation Employment situation: Unemployed Has patient ever been in the [employer]?: No  Education: Education Did Garment/textile technologist From [employer]?: Yes Did You Have Any Difficulty At [employer]?: No   CCA Family/Childhood History Family and Relationship History:    Childhood History:  Childhood History By whom was/is the patient raised?: Both parents Additional childhood history information: stable environment Description of patient's relationship with caregiver when they were  a child: stable Patient's description of current relationship with people who raised him/her: stable relationship Does patient have siblings?: Yes Number of Siblings: 2 Description of patient's current relationship with siblings: sisters--pt reports having a stable relationship with children Did patient suffer any verbal/emotional/physical/sexual abuse as a child?: No Did patient suffer from severe childhood neglect?: No Has patient ever been sexually abused/assaulted/raped as an adolescent or adult?: No Was the patient ever a victim of a crime or a disaster?: No Witnessed domestic violence?: No Has patient been affected by domestic violence as an adult?: No  Child/Adolescent Assessment:     CCA Substance Use Alcohol/Drug Use: Alcohol / Drug Use Pain Medications: see MAR Prescriptions: see MAR Over the Counter: see MAR History of alcohol / drug use?: Yes Substance #1 Name of Substance 1: etoh--socially Substance #2 Name of Substance 2: marijuana--regularly    ASAM's:  Six Dimensions of Multidimensional Assessment  Dimension 1:  Acute Intoxication and/or Withdrawal Potential:   Dimension 1:  Description of individual's past and current experiences of substance use and withdrawal: social etoh and marijuana use  Dimension 2:  Biomedical Conditions and Complications:  Dimension 3:  Emotional, Behavioral, or Cognitive Conditions and Complications:     Dimension 4:  Readiness to Change:     Dimension 5:  Relapse, Continued use, or Continued Problem Potential:     Dimension 6:  Recovery/Living Environment:     ASAM Severity Score: ASAM's Severity Rating Score: 0  ASAM Recommended Level of Treatment:     Substance use Disorder (SUD)    Recommendations for Services/Supports/Treatments: Recommendations for Services/Supports/Treatments Recommendations For Services/Supports/Treatments: Medication Management,Individual Therapy  DSM5 Diagnoses: Patient Active Problem List    Diagnosis Date Noted  . Elevated blood pressure reading 03/10/2018  . Anxiety and depression 01/27/2018    Referrals to Alternative Service(s): Referred to Alternative Service(s):   Place:   Date:   Time:    Referred to Alternative Service(s):   Place:   Date:   Time:    Referred to Alternative Service(s):   Place:   Date:   Time:    Referred to Alternative Service(s):   Place:   Date:   Time:     Ernest Haber Jaquasia Doscher, LCSW

## 2020-06-27 NOTE — ED Notes (Signed)
Pt belongings in locker #17. °

## 2020-06-28 ENCOUNTER — Encounter: Payer: Self-pay | Admitting: Primary Care

## 2020-06-28 ENCOUNTER — Ambulatory Visit (INDEPENDENT_AMBULATORY_CARE_PROVIDER_SITE_OTHER): Payer: Managed Care, Other (non HMO) | Admitting: Primary Care

## 2020-06-28 ENCOUNTER — Other Ambulatory Visit: Payer: Self-pay

## 2020-06-28 VITALS — BP 134/88 | HR 72 | Temp 97.6°F | Ht 67.0 in | Wt 196.0 lb

## 2020-06-28 DIAGNOSIS — Z23 Encounter for immunization: Secondary | ICD-10-CM

## 2020-06-28 DIAGNOSIS — F32A Depression, unspecified: Secondary | ICD-10-CM

## 2020-06-28 DIAGNOSIS — F419 Anxiety disorder, unspecified: Secondary | ICD-10-CM | POA: Diagnosis not present

## 2020-06-28 LAB — CBC
HCT: 45.7 % (ref 39.0–52.0)
Hemoglobin: 15.8 g/dL (ref 13.0–17.0)
MCHC: 34.6 g/dL (ref 30.0–36.0)
MCV: 84.9 fl (ref 78.0–100.0)
Platelets: 170 10*3/uL (ref 150.0–400.0)
RBC: 5.38 Mil/uL (ref 4.22–5.81)
RDW: 13 % (ref 11.5–15.5)
WBC: 5.4 10*3/uL (ref 4.0–10.5)

## 2020-06-28 LAB — COMPREHENSIVE METABOLIC PANEL
ALT: 20 U/L (ref 0–53)
AST: 17 U/L (ref 0–37)
Albumin: 4.7 g/dL (ref 3.5–5.2)
Alkaline Phosphatase: 49 U/L (ref 39–117)
BUN: 11 mg/dL (ref 6–23)
CO2: 30 mEq/L (ref 19–32)
Calcium: 10 mg/dL (ref 8.4–10.5)
Chloride: 101 mEq/L (ref 96–112)
Creatinine, Ser: 1.02 mg/dL (ref 0.40–1.50)
GFR: 103.06 mL/min (ref >60.00)
Glucose, Bld: 114 mg/dL — ABNORMAL HIGH (ref 70–99)
Potassium: 4.1 mEq/L (ref 3.5–5.1)
Sodium: 139 mEq/L (ref 135–145)
Total Bilirubin: 0.7 mg/dL (ref 0.2–1.2)
Total Protein: 7.5 g/dL (ref 6.0–8.3)

## 2020-06-28 LAB — VITAMIN B12: Vitamin B-12: 444 pg/mL (ref 211–911)

## 2020-06-28 LAB — TSH: TSH: 1.98 u[IU]/mL (ref 0.35–4.50)

## 2020-06-28 MED ORDER — SERTRALINE HCL 50 MG PO TABS: 50.0000 mg | ORAL_TABLET | Freq: Every day | ORAL | 1 refills | Status: DC

## 2020-06-28 NOTE — Assessment & Plan Note (Addendum)
Uncontrolled.  No improvement with Lexapro, Prozac lost effectiveness.   Will check some labs today to rule out metabolic cause.  Discussed options, will start Zoloft 50 mg. Patient is to take 1/2 tablet daily for 6 days, then advance to 1 full tablet thereafter. We discussed possible side effects of headache, GI upset, drowsiness, and SI/HI. If thoughts of SI/HI develop, we discussed to present to the emergency immediately. Patient verbalized understanding.   Referral placed to psychiatry Follow up in 6 weeks for re-evaluation, or with psychiatry if he is able to get in sooner.

## 2020-06-28 NOTE — Patient Instructions (Addendum)
Stop by the lab prior to leaving today. I will notify you of your results once received.   You will be contacted regarding your referral to psychiatry.  Please let us know if you have not been contacted within two weeks.   Start sertraline (Zoloft) 50 mg for anxiety and depression. Take 1/2 tablet daily for 6 days, then increase to 1 full tablet thereafter.  Please schedule a follow up visit for 6 weeks for follow up of anxiety/depression, or meet with the psychiatrist if you get in sooner.  It was a pleasure to see you today!    After hours number: [phone] Crisis number: [phone]

## 2020-06-28 NOTE — Progress Notes (Signed)
Subjective:    Patient ID: Tim Ortiz, male    DOB: Dec 23, 1995, 25 y.o.   MRN: [id number]  HPI  This visit occurred during the SARS-CoV-2 public health emergency.  Safety protocols were in place, including screening questions prior to the visit, additional usage of staff PPE, and extensive cleaning of exam room while observing appropriate contact time as indicated for disinfecting solutions.   Tim Ortiz is a 25 year old male with a history of anxiety and depression who presents today to discuss depression.  He has not been seen by me since June 2020, this was a virtual visit. During his visit in June 2020 we were following up on anxiety and depression, endorsed that the dose increase in his fluoxetine to 40 mg was helping some, but not significantly so we switched him to Lexapro 10 mg to gain better effect. Given these changes I told him to follow up in 4-6 weeks, he did not do so.   Evaluated yesterday at the behavorial health walk in clinic, endorsed SI during intake but denied in person. He endorsed that he had been treated with depression with two different medications in the past, neither were effective. Yesterday it was recommended by the nurse practitioner that he set up therapy, he declined. He endorsed regular marijuiana use, social alcohol use. It was recommended that he get connected with psychiatry.    Today he endorses feeling unmotivated, little desire to do anything, little joy in anything, constant worry, feeling anxious. He believes that the Lexapro 10 mg was ineffective. He does admit that fluoxetine was effective but became ineffective even after the dose increase. He denies SI/HI.   Review of Systems  Respiratory: Negative for shortness of breath.   Cardiovascular: Negative for chest pain.  Psychiatric/Behavioral:       See HPI       Past Medical History:  Diagnosis Date  . Depression   . Diverticulitis      Social History   Socioeconomic History  . Marital  status: Single    Spouse name: Not on file  . Number of children: Not on file  . Years of education: Not on file  . Highest education level: Not on file  Occupational History  . Not on file  Tobacco Use  . Smoking status: Former Games developer  . Smokeless tobacco: Never Used  Substance and Sexual Activity  . Alcohol use: Yes  . Drug use: Not on file  . Sexual activity: Not on file  Other Topics Concern  . Not on file  Social History Narrative   Single.    No children.   [employer] for [employer].   Enjoys playing video games, spending time with friends.    Social Determinants of Health   Financial Resource Strain: Not on file  Food Insecurity: Not on file  Transportation Needs: Not on file  Physical Activity: Not on file  Stress: Not on file  Social Connections: Not on file  Intimate Partner Violence: Not on file    History reviewed. No pertinent surgical history.  Family History  Problem Relation Age of Onset  . Depression Mother     No Known Allergies  No current outpatient medications on file prior to visit.   No current facility-administered medications on file prior to visit.    BP 134/88   Pulse 72   Temp 97.6 F (36.4 C) (Temporal)   Ht 5\' 7"  (1.702 m)   Wt 196 lb (88.9 kg)   SpO2  98%   BMI 30.70 kg/m    Objective:   Physical Exam Constitutional:      Appearance: He is well-nourished.  Cardiovascular:     Rate and Rhythm: Normal rate and regular rhythm.  Pulmonary:     Effort: Pulmonary effort is normal.     Breath sounds: Normal breath sounds.  Musculoskeletal:     Cervical back: Neck supple.  Skin:    General: Skin is warm and dry.  Psychiatric:        Mood and Affect: Mood and affect normal.     Comments: Poor eye contact, answers questions in short responses.             Assessment & Plan:

## 2020-06-28 NOTE — Assessment & Plan Note (Addendum)
Uncontrolled.  No improvement with Lexapro, Prozac lost effectiveness.   Will check some labs today to rule out any metabolic cause.  Discussed options, will start Zoloft 50 mg. Patient is to take 1/2 tablet daily for 6 days, then advance to 1 full tablet thereafter. We discussed possible side effects of headache, GI upset, drowsiness, and SI/HI. If thoughts of SI/HI develop, we discussed to present to the emergency immediately. Patient verbalized understanding.   Referral placed to psychiatry Follow up in 6 weeks for re-evaluation, or with psychiatry if he is able to get in sooner.

## 2020-07-20 ENCOUNTER — Other Ambulatory Visit: Payer: Self-pay | Admitting: Primary Care

## 2020-07-20 DIAGNOSIS — F32A Depression, unspecified: Secondary | ICD-10-CM

## 2020-07-20 DIAGNOSIS — F419 Anxiety disorder, unspecified: Secondary | ICD-10-CM

## 2020-07-21 NOTE — Telephone Encounter (Signed)
He was told to set up a 6 week follow up visit during his last appointment with me, he did not. Did he get in with psychiatry?  If not then does not need a 90 day refill, already gave him one additional refill of 30 on his original Rx, and to follow up with me as recommended.

## 2020-07-24 ENCOUNTER — Other Ambulatory Visit: Payer: Self-pay

## 2020-07-24 DIAGNOSIS — F419 Anxiety disorder, unspecified: Secondary | ICD-10-CM

## 2020-07-24 DIAGNOSIS — F32A Depression, unspecified: Secondary | ICD-10-CM

## 2020-07-24 MED ORDER — SERTRALINE HCL 50 MG PO TABS: 50.0000 mg | ORAL_TABLET | Freq: Every day | ORAL | 0 refills | Status: DC

## 2020-07-24 NOTE — Telephone Encounter (Signed)
Patients number has been disconnected and no longer in service.

## 2020-07-24 NOTE — Telephone Encounter (Signed)
Patient is scheduled for follow up on 08/08/20 at 9:40 am. Tim Ortiz would like patient to have 30 day supple sent in to last until his appt. Rx sent.

## 2020-08-08 ENCOUNTER — Ambulatory Visit (INDEPENDENT_AMBULATORY_CARE_PROVIDER_SITE_OTHER): Payer: Managed Care, Other (non HMO) | Admitting: Primary Care

## 2020-08-08 ENCOUNTER — Encounter: Payer: Self-pay | Admitting: Primary Care

## 2020-08-08 ENCOUNTER — Other Ambulatory Visit: Payer: Self-pay

## 2020-08-08 DIAGNOSIS — F32A Depression, unspecified: Secondary | ICD-10-CM

## 2020-08-08 DIAGNOSIS — F419 Anxiety disorder, unspecified: Secondary | ICD-10-CM

## 2020-08-08 MED ORDER — SERTRALINE HCL 100 MG PO TABS: 100.0000 mg | ORAL_TABLET | Freq: Every day | ORAL | 0 refills | Status: DC

## 2020-08-08 NOTE — Progress Notes (Signed)
Subjective:    Patient ID: Tim Ortiz, male    DOB: 26-Mar-1996, 25 y.o.   MRN: [id number]  HPI  This visit occurred during the SARS-CoV-2 public health emergency.  Safety protocols were in place, including screening questions prior to the visit, additional usage of staff PPE, and extensive cleaning of exam room while observing appropriate contact time as indicated for disinfecting solutions.   Tim Ortiz is a 25 year old male with a history of anxiety depression who presents today for follow-up.  He was last evaluated on 06/28/2020 with symptoms of anxiety and depression including little motivation to do anything, feeling little joy in life, constant worry, feeling anxious.  He felt that Lexapro 10 mg was ineffective, also that fluoxetine became ineffective after some time.  We decided to initiate Zoloft 50 mg and have him follow-up 6 weeks later.  We also placed a referral to psychiatry the time.  Since his last visit he's feeling better. Positive effects from Zoloft include feeling more motivated, feeling happier, feeling more like himself.  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  He has noticed excessive palm sweats to his hands, this has improved some. He has yet to hear from the psychiatrist.   He continues to notice anxiety, is having some difficulty sleeping at night. He feels that a dose increase of the Zoloft would benefit him.  Review of Systems  Gastrointestinal: Negative for nausea.  Neurological: Negative for headaches.  Psychiatric/Behavioral: Positive for sleep disturbance. Negative for suicidal ideas. The patient is nervous/anxious.        See HPI       Past Medical History:  Diagnosis Date  . Depression   . Diverticulitis      Social History   Socioeconomic History  . Marital status: Single    Spouse name: Not on file  . Number of children: Not on file  . Years of  education: Not on file  . Highest education level: Not on file  Occupational History  . Not on file  Tobacco Use  . Smoking status: Former Games developer  . Smokeless tobacco: Never Used  Substance and Sexual Activity  . Alcohol use: Yes  . Drug use: Not on file  . Sexual activity: Not on file  Other Topics Concern  . Not on file  Social History Narrative   Single.    No children.   [employer] for [employer].   Enjoys playing video games, spending time with friends.    Social Determinants of Health   Financial Resource Strain: Not on file  Food Insecurity: Not on file  Transportation Needs: Not on file  Physical Activity: Not on file  Stress: Not on file  Social Connections: Not on file  Intimate Partner Violence: Not on file    History reviewed. No pertinent surgical history.  Family History  Problem Relation Age of Onset  . Depression Mother     No Known Allergies  No current outpatient medications on file prior to visit.   No current facility-administered medications on file prior to visit.    BP 126/60   Pulse 77   Temp 98.1 F (36.7 C) (Temporal)   Ht 5\' 7"  (1.702 m)   Wt 199 lb (90.3 kg)   SpO2 97%   BMI 31.17 kg/m    Objective:   Physical Exam Constitutional:      Appearance: He  is well-nourished.  Cardiovascular:     Rate and Rhythm: Normal rate and regular rhythm.  Pulmonary:     Effort: Pulmonary effort is normal.     Breath sounds: Normal breath sounds.  Musculoskeletal:     Cervical back: Neck supple.  Skin:    General: Skin is warm and dry.  Psychiatric:        Mood and Affect: Mood and affect normal.     Comments: More engaged in conversation today, better eye contact, more talkative.            Assessment & Plan:

## 2020-08-08 NOTE — Patient Instructions (Signed)
Increase your sertraline (Zoloft) to 75 mg by taking 1-1/2 tablets daily until your current bottle is empty.  Once your bottle is empty, then start taking sertraline (Zoloft) 100 mg once daily.  I sent a new prescription to your pharmacy today.  You will be contacted regarding your referral to psychiatry.  Please let us know if you have not been contacted within two weeks.   It was a pleasure to see you today!

## 2020-08-08 NOTE — Assessment & Plan Note (Signed)
Improved on sertraline 50 mg, agree that he could benefit from an increased dose.  Increase sertraline to 75 mg x 1 to 2 weeks, then increase to 100 mg thereafter.  New prescription for sertraline 100 mg sent to pharmacy  New referral placed for psychiatry, notified mom to call me in 2 weeks if no one has reached out. 

## 2020-08-08 NOTE — Assessment & Plan Note (Signed)
Improved on sertraline 50 mg, agree that he could benefit from an increased dose.  Increase sertraline to 75 mg x 1 to 2 weeks, then increase to 100 mg thereafter.  New prescription for sertraline 100 mg sent to pharmacy  New referral placed for psychiatry, notified mom to call me in 2 weeks if no one has reached out.

## 2020-08-28 ENCOUNTER — Telehealth: Payer: Self-pay | Admitting: Primary Care

## 2020-08-28 NOTE — Telephone Encounter (Signed)
Patient's mother,Brittany, called.  Patient was referred to Dr.Kapur.  When she called Dr.Kapur's office, they said they don't accept patient's insurance. Patient has [employer] and [employer] secondary.  They don't take Medicaid.  She asked to just use the [facility], but they said they would have to file the Medicaid and they don't accept Medicaid.  Patient's mother said he has been waiting to see a psychiatrist for 2 months.  She would like to get patient in with a psychiatrist as soon as possible.

## 2020-08-28 NOTE — Telephone Encounter (Signed)
At this point I recommend anyone who can see him the soonest. I don't know much about Dr. Tomasa Hose but psychiatrists are tough to get in with at the moment so I recommend he proceed.

## 2020-08-28 NOTE — Telephone Encounter (Signed)
Spoke with patient's mom to let her know that Dr Tomasa Hose in [place] has availabilities as soon as next week. We have been using his office for the patients. Mom wanted to ask Jae Dire to make sure this would be a good option or if Jae Dire has any other suggestions or feedback for another provider. Please let me know and I will call mom back. Thank you

## 2020-08-28 NOTE — Telephone Encounter (Signed)
Patient's mom advised. She will call me back if there are any issues with this

## 2020-11-05 ENCOUNTER — Other Ambulatory Visit: Payer: Self-pay | Admitting: Primary Care

## 2020-11-05 DIAGNOSIS — F32A Depression, unspecified: Secondary | ICD-10-CM

## 2022-01-27 ENCOUNTER — Telehealth: Payer: Self-pay | Admitting: Primary Care

## 2022-01-27 NOTE — Telephone Encounter (Signed)
Patient has appointment with [facility] on 8/16. All precautions given by access nurse

## 2022-01-27 NOTE — Telephone Encounter (Signed)
PLEASE NOTE: All timestamps contained within this report are represented as [place] Standard Time. CONFIDENTIALTY NOTICE: This fax transmission is intended only for the addressee. It contains information that is legally privileged, confidential or otherwise protected from use or disclosure. If you are not the intended recipient, you are strictly prohibited from reviewing, disclosing, copying using or disseminating any of this information or taking any action in reliance on or regarding this information. If you have received this fax in error, please notify us immediately by telephone so that we can arrange for its return to [place]. Phone: [phone], Toll-Free: [phone], Fax: [phone] Page: 1 of 2 Call Id: [id number] [facility] - Client TELEPHONE ADVICE RECORD AccessNurse Patient Name: Tim Ortiz [place] Gender: Male DOB: Apr 15, 1996 Age: 26 Y 9 M 29 D Return Phone Number: [phone] (Primary) Address: [place]  [address] Client [facility] - Client Client Site [facility] - Day Provider Vernona Rieger - NP Contact Type Call Who Is Calling Patient / Member / Family / Caregiver Call Type Triage / Clinical Caller Name Tim Ortiz Relationship To Patient Mother Return Phone Number [phone] (Primary) Chief Complaint TESTICULAR - sudden pain Reason for Call Symptomatic / Request for Health Information Initial Comment Caller states is Tim Ortiz with [facility] - Mom states son is having testicular pain. Translation No Nurse Assessment Nurse: Charna Elizabeth, RN, Cathy Date/Time (Eastern Time): 01/27/2022 2:04:31 PM Confirm and document reason for call. If symptomatic, describe symptoms. ---Tim Ortiz states he developed bilateral testicular pain about 2 weeks ago (current pain rated as a 2 on the 1 to 10 scale). No injury. No fever. Alert and responsive. Does the patient have  any new or worsening symptoms? ---Yes Will a triage be completed? ---Yes Related visit to physician within the last 2 weeks? ---No Does the PT have any chronic conditions? (i.e. diabetes, asthma, this includes High risk factors for pregnancy, etc.) ---No Is this a behavioral health or substance abuse call? ---No Guidelines Guideline Title Affirmed Question Affirmed Notes Nurse Date/Time (Eastern Time) Scrotum Pain [1] Pain comes and goes (intermittent) AND [2] present > 24 hours [place], RN, [facility] 01/27/2022 2:06:25 PM Disp. Time Lamount Cohen Time) Disposition Final User 01/27/2022 2:00:45 PM Send to Urgent Franchot Gallo 01/27/2022 2:07:52 PM See PCP within 24 Hours Yes Trumbull, RN, Lynden Ang PLEASE NOTE: All timestamps contained within this report are represented as [place] Standard Time. CONFIDENTIALTY NOTICE: This fax transmission is intended only for the addressee. It contains information that is legally privileged, confidential or otherwise protected from use or disclosure. If you are not the intended recipient, you are strictly prohibited from reviewing, disclosing, copying using or disseminating any of this information or taking any action in reliance on or regarding this information. If you have received this fax in error, please notify us immediately by telephone so that we can arrange for its return to [place]. Phone: [phone], Toll-Free: [phone], Fax: [phone] Page: 2 of 2 Call Id: [id number] Final Disposition 01/27/2022 2:07:52 PM See PCP within 24 Hours Yes Trumbull, RN, Frann Rider Disagree/Comply Comply Caller Understands Yes PreDisposition Did not know what to do Care Advice Given Per Guideline SEE PCP WITHIN 24 HOURS: * IF OFFICE WILL BE OPEN: You need to be examined within the next 24 hours. Call your doctor (or NP/PA) when the office opens and make an appointment. PAIN MEDICINES: * For pain relief, you can take either acetaminophen, ibuprofen, or naproxen. *  ACETAMINOPHEN - REGULAR STRENGTH TYLENOL: Take 650 mg (two 325 mg pills) by mouth every 4 to 6 hours as needed. Each Regular Strength Tylenol pill has 325 mg of acetaminophen. The most you should take is 10 pills a day (3,250 mg total). Note: In [place], the maximum is 12 pills a day (3,900 mg total). CALL BACK IF: * Severe pain * Constant pain lasts over 1 hour * Swelling or redness occurs * Fever over 100.4 F (38.0 C) * You become worse CARE ADVICE given per Scrotum Pain (Adult) guideline. Referrals REFERRED TO PCP OFFICE

## 2022-01-27 NOTE — Telephone Encounter (Signed)
Patient mother called and stated that patient is having pain in one of his testicles. Patient was sent to access nurse.

## 2022-01-28 ENCOUNTER — Ambulatory Visit (INDEPENDENT_AMBULATORY_CARE_PROVIDER_SITE_OTHER): Payer: Managed Care, Other (non HMO) | Admitting: Nurse Practitioner

## 2022-01-28 ENCOUNTER — Telehealth: Payer: Self-pay | Admitting: Primary Care

## 2022-01-28 ENCOUNTER — Encounter: Payer: Self-pay | Admitting: Nurse Practitioner

## 2022-01-28 VITALS — BP 136/88 | HR 104 | Temp 97.6°F | Resp 12 | Ht 67.0 in | Wt 203.5 lb

## 2022-01-28 DIAGNOSIS — Z7251 High risk heterosexual behavior: Secondary | ICD-10-CM | POA: Diagnosis not present

## 2022-01-28 DIAGNOSIS — N50811 Right testicular pain: Secondary | ICD-10-CM

## 2022-01-28 DIAGNOSIS — N50812 Left testicular pain: Secondary | ICD-10-CM

## 2022-01-28 NOTE — Telephone Encounter (Signed)
Evaluated in office  

## 2022-01-28 NOTE — Progress Notes (Signed)
Acute Office Visit  Subjective:     Patient ID: Tim Ortiz, male    DOB: 1996/06/13, 26 y.o.   MRN: [id number]  Chief Complaint  Patient presents with   Groin Pain    X 2 weeks, bilateral, has gotten worse a little. Has had some pain-throbbing pain, in the testicle area also. With bending down pain increases it seems. No urinary issues.     Patient is in today for Groin pain   States that it started all of the sudden. States that it is all the ime. 2-3 days last week that it did not happen. Statea a deep dull pain. States ibuprofen does help it go away. States that he is sexual active last encounter was 2-3 months ago and unprotected. No discharge from the penis  States that he does not do manual labor. No history of hernias.  BM daily to twice a day. Has been normal  States that when he bends down that he can feel some discomfort in the lower abdomen    States that usually the left testicle bothers him, but can be both Review of Systems  Constitutional:  Negative for chills and fever.  Gastrointestinal:  Positive for abdominal pain. Negative for blood in stool, nausea and vomiting.       Daily  Genitourinary:  Negative for dysuria, frequency, hematuria and urgency.        Objective:    BP 136/88   Pulse (!) 104   Temp 97.6 F (36.4 C) (Temporal)   Resp 12   Ht 5\' 7"  (1.702 m)   Wt 203 lb 8 oz (92.3 kg)   SpO2 98%   BMI 31.87 kg/m    Physical Exam Vitals and nursing note reviewed. Exam conducted with a chaperone present [facility], RMA).  Constitutional:      Appearance: Normal appearance.  Cardiovascular:     Rate and Rhythm: Normal rate and regular rhythm.     Heart sounds: Normal heart sounds.  Pulmonary:     Effort: Pulmonary effort is normal.     Breath sounds: Normal breath sounds.  Abdominal:     General: Bowel sounds are normal. There is no distension.     Palpations: There is no mass.     Tenderness: There is no abdominal  tenderness.     Hernia: No hernia is present. There is no hernia in the left inguinal area or right inguinal area.  Genitourinary:    Penis: Normal.      Testes:        Right: Mass present. Tenderness not present.        Left: Tenderness present. Mass not present.     Epididymis:     Right: Normal.     Left: Normal.    Lymphadenopathy:     Lower Body: No right inguinal adenopathy. No left inguinal adenopathy.  Neurological:     Mental Status: He is alert.     No results found for any visits on 01/28/22.      Assessment & Plan:   Problem List Items Addressed This Visit       Other   High risk sexual behavior    Had unprotected sex approx 2-3 months ago. Encourage to abstain while waiting on test results.       Relevant Orders   Urine cytology ancillary only   HIV antibody (with reflex)   RPR   Pain in both testicles - Primary    Ambiguous in  nature. Has been going on for a couple weeks. Did have unprotected sex in the past few months. No injury. Exam benign in office. Pending labs and US scrotum. Signs and symptoms reviewed when to seek emergent health care.  Can use otc analgesics as needed for discomfort  L>R      Relevant Orders   Urine cytology ancillary only   RPR   US SCROTUM W/DOPPLER    No orders of the defined types were placed in this encounter.   Return if symptoms worsen or fail to improve.  Audria Nine, NP

## 2022-01-28 NOTE — Telephone Encounter (Signed)
Patient was seen today by Tim Ortiz. I received a call from Tamika at [facility] stating that patient will need to come back due to his sample having the wrong patient sticker on it. Please advise. Thank you!

## 2022-01-28 NOTE — Assessment & Plan Note (Addendum)
Ambiguous in nature. Has been going on for a couple weeks. Did have unprotected sex in the past few months. No injury. Exam benign in office. Pending labs and US scrotum. Signs and symptoms reviewed when to seek emergent health care.  Can use otc analgesics as needed for discomfort  L>R

## 2022-01-28 NOTE — Assessment & Plan Note (Signed)
Had unprotected sex approx 2-3 months ago. Encourage to abstain while waiting on test results.

## 2022-01-28 NOTE — Patient Instructions (Signed)
Nice to see you today I will be in touch with the labs once I have the result Follow up if no improvement Taking ibuprofen as directed is ok   Call the imaging center and get the ultrasound ([place]) set up

## 2022-01-28 NOTE — Telephone Encounter (Signed)
Left message for patient to call back to apologize and have urine re done.

## 2022-01-29 ENCOUNTER — Other Ambulatory Visit (HOSPITAL_COMMUNITY)
Admission: RE | Admit: 2022-01-29 | Discharge: 2022-01-29 | Disposition: A | Discharge status: Home / Self Care | Payer: Managed Care, Other (non HMO) | Source: Ambulatory Visit | Attending: Nurse Practitioner | Admitting: Nurse Practitioner

## 2022-01-29 DIAGNOSIS — Z7251 High risk heterosexual behavior: Secondary | ICD-10-CM | POA: Insufficient documentation

## 2022-01-29 DIAGNOSIS — N50812 Left testicular pain: Secondary | ICD-10-CM | POA: Diagnosis present

## 2022-01-29 DIAGNOSIS — N50811 Right testicular pain: Secondary | ICD-10-CM | POA: Diagnosis present

## 2022-01-29 LAB — RPR: RPR Ser Ql: NONREACTIVE

## 2022-01-29 LAB — HIV ANTIBODY (ROUTINE TESTING W REFLEX): HIV 1&2 Ab, 4th Generation: NONREACTIVE

## 2022-01-29 NOTE — Addendum Note (Signed)
Addended by: Consuella Lose on: 01/29/2022 11:15 AM   Modules accepted: Orders

## 2022-01-29 NOTE — Telephone Encounter (Signed)
Called 3 times today but keeps going to the voicemail. Asked patient's mom to have patient call me back Need to have patient come back by to give another urine sample. No appointment needed. Anytime except between 1 and 2 pm.

## 2022-01-29 NOTE — Telephone Encounter (Signed)
Spoke with patient and he will come by 01/30/22 to recollect

## 2022-01-30 ENCOUNTER — Ambulatory Visit
Admission: RE | Admit: 2022-01-30 | Discharge: 2022-01-30 | Disposition: A | Discharge status: Home / Self Care | Payer: Managed Care, Other (non HMO) | Source: Ambulatory Visit | Attending: Nurse Practitioner

## 2022-01-30 DIAGNOSIS — N50811 Right testicular pain: Secondary | ICD-10-CM

## 2022-02-03 LAB — URINE CYTOLOGY ANCILLARY ONLY
Chlamydia: NEGATIVE
Comment: NEGATIVE
Comment: NEGATIVE
Comment: NORMAL
Neisseria Gonorrhea: NEGATIVE
Trichomonas: NEGATIVE

## 2022-02-04 ENCOUNTER — Telehealth: Payer: Self-pay | Admitting: Nurse Practitioner

## 2022-02-04 NOTE — Telephone Encounter (Signed)
Patient called back about message below:   Consuella Lose, CMA  02/04/2022 12:39 PM EDT Back to Top    Left message to call back   Eden Emms, NP  02/04/2022  7:35 AM EDT     All the urine test came back normal. Not sure why he is having testicular pain. If he would like to see urology I am more than happy to refer him     Patient is aware and asked if he could be referred to urology

## 2022-02-04 NOTE — Telephone Encounter (Signed)
See lab result notes

## 2022-02-05 ENCOUNTER — Telehealth: Payer: Self-pay | Admitting: Nurse Practitioner

## 2022-02-05 DIAGNOSIS — N50811 Right testicular pain: Secondary | ICD-10-CM

## 2022-02-05 NOTE — Telephone Encounter (Signed)
-----   Message from Erby Pian, [place] sent at 02/04/2022  2:37 PM EDT ----- Spoke to pt and relayed results, per [facility]. Pt states that he would like a referral to urology, and he says location doesn't matter.

## 2022-02-05 NOTE — Telephone Encounter (Signed)
Referral placed.

## 2022-02-13 ENCOUNTER — Encounter: Payer: Self-pay | Admitting: *Deleted

## 2022-03-17 ENCOUNTER — Ambulatory Visit: Payer: Managed Care, Other (non HMO) | Admitting: Urology

## 2022-03-17 VITALS — BP 135/91 | HR 86 | Ht 66.0 in | Wt 202.6 lb

## 2022-03-17 DIAGNOSIS — N50811 Right testicular pain: Secondary | ICD-10-CM

## 2022-03-17 DIAGNOSIS — N50812 Left testicular pain: Secondary | ICD-10-CM

## 2022-03-17 LAB — MICROSCOPIC EXAMINATION

## 2022-03-17 LAB — URINALYSIS, COMPLETE
Bilirubin, UA: NEGATIVE
Glucose, UA: NEGATIVE
Ketones, UA: NEGATIVE
Leukocytes,UA: NEGATIVE
Nitrite, UA: NEGATIVE
RBC, UA: NEGATIVE
Specific Gravity, UA: 1.03 (ref 1.005–1.030)
Urobilinogen, Ur: 0.2 mg/dL (ref 0.2–1.0)
pH, UA: 5.5 (ref 5.0–7.5)

## 2022-03-17 NOTE — Progress Notes (Signed)
03/17/2022 1:01 PM   Tim Ortiz 1995/10/08 [id number]  Referring provider: Michela Pitcher, NP [place],  [place] [address]  Chief Complaint  Patient presents with   Testicle Pain    HPI: 26 year old male who presents today for further evaluation of testicular pain.  He reports that about 2 months ago, he started having aching in his scrotum, left side greater than the right.  Initially, the pain come on for several minutes and then subside and happen fairly often but over the past several weeks, the frequency of this seem to be significantly less.    He denies any overt scrotal swelling or associated urinary issues including no urgency, frequency, dysuria, gross hematuria or penile discharge.  He specifically denies pain in his perineum.  He does have some associated lower abdominal discomfort, mild crosses his diffuse lower abdomen associated with the discomfort.  He denies any flank pain.  No history of kidney stones.  He does wear briefs.  Pain seems to be exacerbated with driving long distances.  He does not recall any trauma.  He is undergone extensive evaluation with his primary care including a scrotal ultrasound on 02/01/2022 which was unremarkable.  He also had a negative urinalysis and negative STI testing.   PMH: Past Medical History:  Diagnosis Date   Depression    Diverticulitis     Surgical History: No past surgical history on file.  Home Medications:  Allergies as of 03/17/2022       Reactions   Sertraline Other (See Comments)   Felt "foggy in the head,"        Medication List    as of March 17, 2022  1:01 PM   You have not been prescribed any medications.     Allergies:  Allergies  Allergen Reactions   Sertraline Other (See Comments)    Felt "foggy in the head,"    Family History: Family History  Problem Relation Age of Onset   Depression Mother     Social History:  reports that he has quit smoking. He has never  used smokeless tobacco. He reports current alcohol use. No history on file for drug use.   Physical Exam: BP (!) 135/91   Pulse 86   Ht 5\' 6"  (1.676 m)   Wt 202 lb 9.6 oz (91.9 kg)   BMI 32.70 kg/m   Constitutional:  Alert and oriented, No acute distress. HEENT: [place] AT, moist mucus membranes.  Trachea midline, no masses. Cardiovascular: No clubbing, cyanosis, or edema. Respiratory: Normal respiratory effort, no increased work of breathing. GI: Abdomen is soft, nontender, nondistended, no abdominal masses GU: Bilateral inguinal exam unremarkable, no evidence of hernia.  Circumcised phallus with orthotopic meatus without discharge.  Bilateral descended testicles which are nontender, no masses.  No skin changes. Skin: No rashes, bruises or suspicious lesions. Neurologic: Grossly intact, no focal deficits, moving all 4 extremities. Psychiatric: Normal mood and affect.  Laboratory Data: Lab Results  Component Value Date   WBC 5.4 06/28/2020   HGB 15.8 06/28/2020   HCT 45.7 06/28/2020   MCV 84.9 06/28/2020   PLT 170.0 06/28/2020    Lab Results  Component Value Date   CREATININE 1.02 06/28/2020  \  CLINICAL DATA:  Bilateral testicular pain.  Right testicular mass.   EXAM: SCROTAL ULTRASOUND   DOPPLER ULTRASOUND OF THE TESTICLES   TECHNIQUE: Complete ultrasound examination of the testicles, epididymis, and other scrotal structures was performed. Color and spectral Doppler ultrasound  were also utilized to evaluate blood flow to the testicles.   COMPARISON:  None Available.   FINDINGS: Right testicle   Measurements: 5 x 2.7 x 3.1 cm. Homogeneous echogenicity. Normal blood flow. No intra or extratesticular mass. No microlithiasis.   Left testicle   Measurements: 4.7 x 2.2 x 3.1 cm. Homogeneous echogenicity. Normal blood flow. No intra or extra testicular mass. No microlithiasis.   Right epididymis: Normal in size and appearance. No epididymal cyst.   Left  epididymis:  Normal in size and appearance.   Hydrocele:  None visualized.   Varicocele:  None visualized.   Pulsed Doppler interrogation of both testes demonstrates normal low resistance arterial and venous waveforms bilaterally.   IMPRESSION: Unremarkable scrotal ultrasound. No explanation for pain. No right testicular or scrotal mass.     Electronically Signed   By: Narda Rutherford M.D.   On: 02/01/2022 23:11  Scrotal ultrasound personally reviewed, agree with radiologic interpretation   Assessment & Plan:    1. Pain in both testicles Symptoms, suspect he may have had a mild inflammatory orchitis versus prostatitis.  No evidence of bacterial process.    Overall, symptoms are improving.  Recommend NSAIDs, supportive care and time, anticipate full resolution.  Exam and ultrasound are also reassuring  Return if symptoms worsen or fail to resolve   Vanna Scotland, MD  [facility] [address], [address] [place], [place] [address] [phone]

## 2022-06-15 DIAGNOSIS — Z419 Encounter for procedure for purposes other than remedying health state, unspecified: Secondary | ICD-10-CM | POA: Diagnosis not present

## 2023-05-31 DIAGNOSIS — R051 Acute cough: Secondary | ICD-10-CM | POA: Diagnosis not present

## 2023-05-31 DIAGNOSIS — J029 Acute pharyngitis, unspecified: Secondary | ICD-10-CM | POA: Diagnosis not present

## 2023-05-31 DIAGNOSIS — I517 Cardiomegaly: Secondary | ICD-10-CM | POA: Diagnosis not present

## 2023-05-31 DIAGNOSIS — R000 Tachycardia, unspecified: Secondary | ICD-10-CM | POA: Diagnosis not present

## 2023-06-01 DIAGNOSIS — R000 Tachycardia, unspecified: Secondary | ICD-10-CM | POA: Diagnosis not present

## 2023-06-01 DIAGNOSIS — I517 Cardiomegaly: Secondary | ICD-10-CM | POA: Diagnosis not present

## 2023-07-29 ENCOUNTER — Encounter: Payer: Medicaid Other | Admitting: Primary Care

## 2023-08-04 ENCOUNTER — Encounter: Payer: Self-pay | Admitting: Primary Care

## 2023-08-04 ENCOUNTER — Telehealth (INDEPENDENT_AMBULATORY_CARE_PROVIDER_SITE_OTHER): Payer: Medicaid Other | Admitting: Primary Care

## 2023-08-04 VITALS — Ht 68.0 in | Wt 215.0 lb

## 2023-08-04 DIAGNOSIS — R030 Elevated blood-pressure reading, without diagnosis of hypertension: Secondary | ICD-10-CM

## 2023-08-04 DIAGNOSIS — F411 Generalized anxiety disorder: Secondary | ICD-10-CM

## 2023-08-04 MED ORDER — ESCITALOPRAM OXALATE 10 MG PO TABS: 10.0000 mg | ORAL_TABLET | Freq: Every day | ORAL | 0 refills | Status: DC

## 2023-08-04 NOTE — Progress Notes (Signed)
Patient ID: Tim Ortiz, male    DOB: January 06, 1996, 28 y.o.   MRN: [id number]  Virtual visit completed through [employer], a video enabled telemedicine application. Due to national recommendations of social distancing due to COVID-19, a virtual visit is felt to be most appropriate for this patient at this time. Reviewed limitations, risks, security and privacy concerns of performing a virtual visit and the availability of in person appointments. I also reviewed that there may be a patient responsible charge related to this service. The patient agreed to proceed.   Patient location: home Provider location:  at [facility], office Persons participating in this virtual visit: patient, provider   If any vitals were documented, they were collected by patient at home unless specified below.    Ht 5\' 8"  (1.727 m) Comment: Per patient  Wt 215 lb (97.5 kg) Comment: Per patient  BMI 32.69 kg/m    CC: Elevated BP readings.  Subjective:   HPI: Tim Ortiz is a 28 y.o. male with a history of anxiety, depression presenting on 08/04/2023 to discuss anxiety and elevated blood pressure.  for Hypertension (Would like to discuss High BP, patient went to UC in Dec BP was 139/97 and he thought it best to come discuss/Patient does not check BP at home and does not have any other readings available. )  Elevated at urgent care on 05/31/2023 through [facility] for multiple symptoms including sore throat, cough, tachycardia.  Blood pressure during that visit was 139/97.  He was diagnosed with viral syndrome and treated with conservative care.  He has been checking his BP infrequently at home which has been running 137/110 (today), 149/? (Four days ago). He has a family history of hypertension in his mother.   He denies headaches, visual changes, dizziness. He began exercising last week but had to stop due to his recent viral illness. He believes he has influenza with symptoms of fatigue, chills,  body aches, fevers. Today he's feeling some better. He recently stopped nicotine.   Diet currently consists of:  Breakfast: Skips Lunch: Sandwich Dinner: Fast food, take out food Snacks: Infrequently  Desserts: Several times weekly  Beverages: Water mostly.  2) GAD: Chronic for years. Symptoms include constant worrying, difficulty stopping worry, feeling anxious. During his recent viral illness he had a panic attack as he became afraid he was going to die. He believes his anxiety has a lot to do with his elevated blood pressure readings.   He was once managed on Zoloft in the past, stopped taking as he felt "numb". Previously managed on Lexapro and Prozac, doesn't recall why he stopped.  He was referred to psychiatry previously, never made it to that appointment as "life happened".  Currently he has been using nicotine to combat his anxiety symptoms, but is realizing that this is an unhealthy practice.  He would like to resume treatment.  He is open to seeing psychiatry.   BP Readings from Last 3 Encounters:  03/17/22 (!) 135/91  01/28/22 136/88  08/08/20 126/60         Relevant past medical, surgical, family and social history reviewed and updated as indicated. Interim medical history since our last visit reviewed. Allergies and medications reviewed and updated. No outpatient medications prior to visit.   No facility-administered medications prior to visit.     Per HPI unless specifically indicated in ROS section below Review of Systems  Constitutional:  Positive for fatigue and fever.  Respiratory:  Positive for cough.  Cardiovascular:  Negative for chest pain.  Musculoskeletal:  Positive for myalgias.  Neurological:  Negative for dizziness and headaches.  Psychiatric/Behavioral:  The patient is nervous/anxious.    Objective:  Ht 5\' 8"  (1.727 m) Comment: Per patient  Wt 215 lb (97.5 kg) Comment: Per patient  BMI 32.69 kg/m   Wt Readings from Last 3 Encounters:   08/04/23 215 lb (97.5 kg)  03/17/22 202 lb 9.6 oz (91.9 kg)  01/28/22 203 lb 8 oz (92.3 kg)       Physical exam: General: Alert and oriented x 3, no distress, does not appear sickly  Pulmonary: Speaks in complete sentences without increased work of breathing, no cough during visit.  Psychiatric: Normal mood, thought content, and behavior.     Results for orders placed or performed in visit on 03/17/22  Microscopic Examination   Collection Time: 03/17/22 10:40 AM   Urine  Result Value Ref Range   WBC, UA 0-5 0 - 5 /hpf   RBC, Urine 0-2 0 - 2 /hpf   Epithelial Cells (non renal) 0-10 0 - 10 /hpf   Mucus, UA Present (A) Not Estab.   Bacteria, UA Moderate (A) None seen/Few  Urinalysis, Complete   Collection Time: 03/17/22 10:40 AM  Result Value Ref Range   Specific Gravity, UA 1.030 1.005 - 1.030   pH, UA 5.5 5.0 - 7.5   Color, UA Yellow Yellow   Appearance Ur Clear Clear   Leukocytes,UA Negative Negative   Protein,UA Trace (A) Negative/Trace   Glucose, UA Negative Negative   Ketones, UA Negative Negative   RBC, UA Negative Negative   Bilirubin, UA Negative Negative   Urobilinogen, Ur 0.2 0.2 - 1.0 mg/dL   Nitrite, UA Negative Negative   Microscopic Examination See below:    Assessment & Plan:   Problem List Items Addressed This Visit       Other   GAD (generalized anxiety disorder) - Primary   Uncontrolled and deteriorated.  Reviewed prior office notes from 2019, 2020. Discussed options for treatment, he is willing to try Lexapro again. He also agrees to psychiatry referral.  Start Lexapro 5 mg daily x 1 week, then increase to 10 mg daily thereafter. Referral placed to psychiatry.  Follow-up in 1 month.      Relevant Medications   escitalopram (LEXAPRO) 10 MG tablet   Other Relevant Orders   Ambulatory referral to Psychiatry   Elevated blood pressure reading   Noted during office visit with urology from October 2023, also with home readings.  Agree  that his anxiety could be contributing. We also discussed the importance of a healthy diet and regular exercise to prevent high blood pressure.  He is working on improving his diet and exercising.  We will plan to see him back in the office in 1 month for evaluation of his blood pressure.        Meds ordered this encounter  Medications   escitalopram (LEXAPRO) 10 MG tablet    Sig: Take 1 tablet (10 mg total) by mouth daily. For anxiety    Dispense:  90 tablet    Refill:  0    Supervising Provider:   Ermalene Searing AMY E [[id number]]   Orders Placed This Encounter  Procedures   Ambulatory referral to Psychiatry    Referral Priority:   Routine    Referral Type:   Psychiatric    Referral Reason:   Specialty Services Required    Requested Specialty:   Psychiatry  Number of Visits Requested:   1    I discussed the assessment and treatment plan with the patient. The patient was provided an opportunity to ask questions and all were answered. The patient agreed with the plan and demonstrated an understanding of the instructions. The patient was advised to call back or seek an in-person evaluation if the symptoms worsen or if the condition fails to improve as anticipated.  Follow up plan:  Lexapro 10 mg tablets daily for anxiety.  Take 1/2 tablet by mouth daily x 1 week, then increase to 1 full tablet thereafter.  You will receive a phone call regarding your psychiatry referral.  Please schedule a follow-up visit with me in 1 month.  It was a pleasure to see you today!   [name], NP

## 2023-08-04 NOTE — Patient Instructions (Signed)
Lexapro 10 mg tablets daily for anxiety.  Take 1/2 tablet by mouth daily x 1 week, then increase to 1 full tablet thereafter.  You will receive a phone call regarding your psychiatry referral.  Please schedule a follow-up visit with me in 1 month.  It was a pleasure to see you today!

## 2023-08-04 NOTE — Assessment & Plan Note (Signed)
Noted during office visit with urology from October 2023, also with home readings.  Agree that his anxiety could be contributing. We also discussed the importance of a healthy diet and regular exercise to prevent high blood pressure.  He is working on improving his diet and exercising.  We will plan to see him back in the office in 1 month for evaluation of his blood pressure.

## 2023-08-04 NOTE — Assessment & Plan Note (Signed)
Uncontrolled and deteriorated.  Reviewed prior office notes from 2019, 2020. Discussed options for treatment, he is willing to try Lexapro again. He also agrees to psychiatry referral.  Start Lexapro 5 mg daily x 1 week, then increase to 10 mg daily thereafter. Referral placed to psychiatry.  Follow-up in 1 month.

## 2023-08-14 DIAGNOSIS — F332 Major depressive disorder, recurrent severe without psychotic features: Secondary | ICD-10-CM | POA: Diagnosis not present

## 2023-08-20 ENCOUNTER — Telehealth: Payer: Self-pay

## 2023-08-20 DIAGNOSIS — F332 Major depressive disorder, recurrent severe without psychotic features: Secondary | ICD-10-CM | POA: Diagnosis not present

## 2023-08-20 NOTE — Telephone Encounter (Signed)
 I will re-route this referral.   See referral for updates.

## 2023-08-20 NOTE — Telephone Encounter (Signed)
 Copied from CRM [id number]. Topic: General - Other >> Aug 19, 2023  3:34 PM Kathryne Eriksson wrote: Reason for CRM: Referral >> Aug 19, 2023  3:39 PM Kathryne Eriksson wrote: Darliss Ridgel, MD called on behalf of patient, stating they're NOT in network with patients secondary insurance Medicaid although they are in network with the primary they won't be able to schedule patient.

## 2023-08-27 DIAGNOSIS — F332 Major depressive disorder, recurrent severe without psychotic features: Secondary | ICD-10-CM | POA: Diagnosis not present

## 2023-09-04 DIAGNOSIS — F332 Major depressive disorder, recurrent severe without psychotic features: Secondary | ICD-10-CM | POA: Diagnosis not present

## 2023-09-17 ENCOUNTER — Ambulatory Visit: Payer: Self-pay

## 2023-09-17 NOTE — Telephone Encounter (Signed)
 Chief Complaint: blood on stool Symptoms: bloody stool  Frequency: x 1 Pertinent Negatives: Patient denies pain  Disposition: [] ED /[] Urgent Care (no appt availability in office) / [x] Appointment(In office/virtual)/ []  [facility]/ [] Home Care/ [] Refused Recommended Disposition /[]  Mobile Bus/ []  Follow-up with PCP Additional Notes: pt states that he recently changed his diet and exercise routine. States that he had a episode of of diarrhea stool and it had blood in it. States not on tissue but also not enough to turn water red.  States just a small around.   Copied from CRM [id number]. Topic: Clinical - Red Word Triage >> Sep 17, 2023  3:35 PM Truddie Crumble wrote: Reason for CRM: patient called stating he drunk two red Gatorades last night and this morning when he had a bowel movement he saw a tint of red in his stool. Patient does not know if it was blood or not Reason for Disposition  MILD rectal bleeding (more than just a few drops or streaks)  Answer Assessment - Initial Assessment Questions 1. APPEARANCE of BLOOD: "What color is it?" "Is it passed separately, on the surface of the stool, or mixed in with the stool?"      Mixed in stool 2. AMOUNT: "How much blood was passed?"      Less than teaspoon 3. FREQUENCY: "How many times has blood been passed with the stools?"      1 4. ONSET: "When was the blood first seen in the stools?" (Days or weeks)      today 5. DIARRHEA: "Is there also some diarrhea?" If Yes, ask: "How many diarrhea stools in the past 24 hours?"      2 6. CONSTIPATION: "Do you have constipation?" If Yes, ask: "How bad is it?"     no 7. RECURRENT SYMPTOMS: "Have you had blood in your stools before?" If Yes, ask: "When was the last time?" and "What happened that time?"      no 8. BLOOD THINNERS: "Do you take any blood thinners?" (e.g., Coumadin/warfarin, Pradaxa/dabigatran, aspirin)     no 9. OTHER SYMPTOMS: "Do you have any other symptoms?"  (e.g.,  abdomen pain, vomiting, dizziness, fever)     no  Protocols used: Rectal Bleeding-A-AH

## 2023-09-17 NOTE — Telephone Encounter (Signed)
 Noted.

## 2023-09-21 ENCOUNTER — Ambulatory Visit (INDEPENDENT_AMBULATORY_CARE_PROVIDER_SITE_OTHER): Admitting: Family Medicine

## 2023-09-21 ENCOUNTER — Encounter: Payer: Self-pay | Admitting: Family Medicine

## 2023-09-21 VITALS — BP 104/78 | HR 75 | Temp 97.9°F | Ht 68.0 in | Wt 213.0 lb

## 2023-09-21 DIAGNOSIS — K921 Melena: Secondary | ICD-10-CM

## 2023-09-21 NOTE — Progress Notes (Signed)
 Patient ID: Tim Ortiz, male    DOB: Nov 23, 1995, 28 y.o.   MRN: [id number]  This visit was conducted in person.  BP 104/78 (BP Location: Left Arm, Patient Position: Sitting, Cuff Size: Large)   Pulse 75   Temp 97.9 F (36.6 C) (Temporal)   Ht 5\' 8"  (1.727 m)   Wt 213 lb (96.6 kg)   SpO2 99%   BMI 32.39 kg/m    CC:  Chief Complaint  Patient presents with   Blood In Stools    Subjective:   HPI: Tim Ortiz is a 28 y.o. male patient of [name] presenting on 09/21/2023 for Blood In Stools   Last week noted possible blood in stool ... Toilet water  looked darker, possibly like blood mixed with darker urine.   Noted no abdominal pain.  Went of an run,  Felt dehydrated... drank lots of  Gatorade red 32 oz.  No rectal soreness, no constipation.  Has bowel movements daily.  No family history of bowel issue, colon cancer.    Knows friend dx with colon cancer at his age... worried about this.     Relevant past medical, surgical, family and social history reviewed and updated as indicated. Interim medical history since our last visit reviewed. Allergies and medications reviewed and updated. Outpatient Medications Prior to Visit  Medication Sig Dispense Refill   escitalopram (LEXAPRO) 10 MG tablet Take 1 tablet (10 mg total) by mouth daily. For anxiety 90 tablet 0   No facility-administered medications prior to visit.     Per HPI unless specifically indicated in ROS section below Review of Systems  Constitutional:  Negative for fatigue and fever.  HENT:  Negative for ear pain.   Eyes:  Negative for pain.  Respiratory:  Negative for cough and shortness of breath.   Cardiovascular:  Negative for chest pain, palpitations and leg swelling.  Gastrointestinal:  Negative for abdominal pain.  Genitourinary:  Negative for dysuria.  Musculoskeletal:  Negative for arthralgias.  Neurological:  Negative for syncope, light-headedness and headaches.  Psychiatric/Behavioral:   Negative for dysphoric mood.    Objective:  BP 104/78 (BP Location: Left Arm, Patient Position: Sitting, Cuff Size: Large)   Pulse 75   Temp 97.9 F (36.6 C) (Temporal)   Ht 5\' 8"  (1.727 m)   Wt 213 lb (96.6 kg)   SpO2 99%   BMI 32.39 kg/m   Wt Readings from Last 3 Encounters:  09/21/23 213 lb (96.6 kg)  08/04/23 215 lb (97.5 kg)  03/17/22 202 lb 9.6 oz (91.9 kg)      Physical Exam Vitals reviewed.  Constitutional:      Appearance: He is well-developed.  HENT:     Head: Normocephalic.     Right Ear: Hearing normal.     Left Ear: Hearing normal.     Nose: Nose normal.  Neck:     Thyroid: No thyroid mass or thyromegaly.     Vascular: No carotid bruit.     Trachea: Trachea normal.  Cardiovascular:     Rate and Rhythm: Normal rate and regular rhythm.     Pulses: Normal pulses.     Heart sounds: Heart sounds not distant. No murmur heard.    No friction rub. No gallop.     Comments: No peripheral edema Pulmonary:     Effort: Pulmonary effort is normal. No respiratory distress.     Breath sounds: Normal breath sounds.  Skin:    General: Skin is warm  and dry.     Findings: No rash.  Psychiatric:        Speech: Speech normal.        Behavior: Behavior normal.        Thought Content: Thought content normal.       Results for orders placed or performed in visit on 03/17/22  Microscopic Examination   Collection Time: 03/17/22 10:40 AM   Urine  Result Value Ref Range   WBC, UA 0-5 0 - 5 /hpf   RBC, Urine 0-2 0 - 2 /hpf   Epithelial Cells (non renal) 0-10 0 - 10 /hpf   Mucus, UA Present (A) Not Estab.   Bacteria, UA Moderate (A) None seen/Few  Urinalysis, Complete   Collection Time: 03/17/22 10:40 AM  Result Value Ref Range   Specific Gravity, UA 1.030 1.005 - 1.030   pH, UA 5.5 5.0 - 7.5   Color, UA Yellow Yellow   Appearance Ur Clear Clear   Leukocytes,UA Negative Negative   Protein,UA Trace (A) Negative/Trace   Glucose, UA Negative Negative   Ketones, UA  Negative Negative   RBC, UA Negative Negative   Bilirubin, UA Negative Negative   Urobilinogen, Ur 0.2 0.2 - 1.0 mg/dL   Nitrite, UA Negative Negative   Microscopic Examination See below:     Assessment and Plan  Blood in stool Assessment & Plan:  Acute... no additional symptoms.  Noted once after 64 oz of red Gatorade. Heme occult cards negative in office and nml rectal exam.   Pt to return for anoscopy if notes further blood in stool, but most likely the color cahnge was from the large amount of Gatorade prior.     No follow-ups on file.   Kerby Nora, MD

## 2023-09-21 NOTE — Assessment & Plan Note (Signed)
 Acute... no additional symptoms.  Noted once after 64 oz of red Gatorade. Heme occult cards negative in office and nml rectal exam.   Pt to return for anoscopy if notes further blood in stool, but most likely the color cahnge was from the large amount of Gatorade prior.

## 2023-09-27 ENCOUNTER — Telehealth: Payer: Self-pay | Admitting: Primary Care

## 2023-09-27 NOTE — Telephone Encounter (Signed)
 Called and spoke with patient he states he has not heard back from anyone after he was told one office would not accept medicaid. Please check on the psychiatry referral.

## 2023-09-27 NOTE — Telephone Encounter (Signed)
 Copied from CRM [id number]. Topic: Referral - Status >> Sep 27, 2023  2:02 PM Albertha Alosa wrote: Reason for CRM: Patient called in regarding referral for psychiatry, would like for someone to give him a callback regarding this

## 2023-09-27 NOTE — Telephone Encounter (Signed)
 Unable to reach patient. Left voicemail to return call to our office.

## 2023-10-14 NOTE — Telephone Encounter (Signed)
 Apparently they are not accepting new Medicaid patients at this time.  Advise that patient should contact his insurance to find a provider.

## 2023-10-14 NOTE — Telephone Encounter (Signed)
 Left voicemail per dpr advising patient of referral coordinator message.

## 2024-01-31 ENCOUNTER — Ambulatory Visit (INDEPENDENT_AMBULATORY_CARE_PROVIDER_SITE_OTHER): Admitting: Primary Care

## 2024-01-31 ENCOUNTER — Encounter: Payer: Self-pay | Admitting: Primary Care

## 2024-01-31 VITALS — BP 142/90 | HR 118 | Temp 98.2°F | Ht 68.0 in | Wt 220.0 lb

## 2024-01-31 DIAGNOSIS — F411 Generalized anxiety disorder: Secondary | ICD-10-CM

## 2024-01-31 DIAGNOSIS — F331 Major depressive disorder, recurrent, moderate: Secondary | ICD-10-CM

## 2024-01-31 MED ORDER — BUPROPION HCL ER (XL) 150 MG PO TB24: 150.0000 mg | ORAL_TABLET | Freq: Every day | ORAL | 0 refills | Status: DC

## 2024-01-31 NOTE — Assessment & Plan Note (Signed)
 Uncontrolled.  As he has failed 3 medications, will refer to psychiatry. He agrees.  Referral placed.   He also agrees to try another medication option.  Start Wellbutrin  XL 150 mg daily. Follow-up in 1 month with us  or with psychiatry, whichever one comes first.

## 2024-01-31 NOTE — Progress Notes (Signed)
 Subjective:    Patient ID: Tim Ortiz, male    DOB: [birth year], 28 y.o.   MRN: [id number]   History of Present Illness    Tim Ortiz is a very pleasant 28 y.o. male with a history of GAD, depression who presents today to discuss difficulty concentrating.  No formal or prior diagnosis. 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   Symptoms include little motivation to start a task or do tasks. He sometimes has difficulty concentrating. If he isn't being supervised at work then he will procrastinate for long periods of time before doing anything. He has a hard time motivating himself to make phone calls for work and scheduling appointments, also with normal ADL such as brushing teeth.   He has failed numerous antidepressants such as Zoloft , Lexapro , Prozac .      01/31/2024   11:12 AM 06/27/2020    6:49 PM 01/27/2018   11:30 AM  GAD 7 : Generalized Anxiety Score  Nervous, Anxious, on Edge 2  1  Control/stop worrying 2  0  Worry too much - different things 3  0  Trouble relaxing 3  3  Restless 0  3  Easily annoyed or irritable 1  3  Afraid - awful might happen 0  0  Total GAD 7 Score 11  10  Anxiety Difficulty Somewhat difficult  Not difficult at all     Information is confidential and restricted. Go to Review Flowsheets to unlock data.       01/31/2024   11:12 AM 08/04/2023   10:04 AM 08/08/2020    9:52 AM  PHQ9 SCORE ONLY  PHQ-9 Total Score 18 0 6      Review of Systems  Respiratory:  Negative for shortness of breath.   Cardiovascular:  Negative for chest pain.  Psychiatric/Behavioral:  The patient is nervous/anxious.        See HPI         Past Medical History:  Diagnosis Date   Depression    Diverticulitis     Social History   Socioeconomic History   Marital status: Single    Spouse name: Not on file   Number of children: Not on file   Years of education: Not on file   Highest  education level: Not on file  Occupational History   Not on file  Tobacco Use   Smoking status: Former   Smokeless tobacco: Never  Substance and Sexual Activity   Alcohol use: Yes   Drug use: Not on file   Sexual activity: Not on file  Other Topics Concern   Not on file  Social History Narrative   Single.    No children.   [employer] for [employer].   Enjoys playing video games, spending time with friends.    Social Drivers of Corporate investment banker Strain: Not on file  Food Insecurity: Not on file  Transportation Needs: Not on file  Physical Activity: Not on file  Stress: Not on file  Social Connections: Not on file  Intimate Partner Violence: Not on file    History reviewed. No pertinent surgical history.  Family History  Problem Relation Age of Onset   Depression Mother     Allergies  Allergen Reactions   Sertraline  Other (See Comments)    Felt foggy in the head,    No current outpatient medications on file prior to visit.   No current facility-administered medications  on file prior to visit.    BP (!) 142/90   Pulse (!) 118   Temp 98.2 F (36.8 C) (Temporal)   Ht 5' 8 (1.727 m)   Wt 220 lb (99.8 kg)   SpO2 98%   BMI 33.45 kg/m  Objective:   Physical Exam Cardiovascular:     Rate and Rhythm: Normal rate and regular rhythm.  Pulmonary:     Effort: Pulmonary effort is normal.     Breath sounds: Normal breath sounds.  Musculoskeletal:     Cervical back: Neck supple.  Skin:    General: Skin is warm and dry.  Neurological:     Mental Status: He is alert and oriented to person, place, and time.  Psychiatric:        Mood and Affect: Mood normal.     Physical Exam        Assessment & Plan:  GAD (generalized anxiety disorder) Assessment & Plan: Uncontrolled.  As he has failed 3 medications, will refer to psychiatry. He agrees.  Referral placed.   He also agrees to try another medication option.  Start Wellbutrin  XL 150 mg  daily. Follow-up in 1 month with us  or with psychiatry, whichever one comes first.  Orders: -     buPROPion  HCl ER (XL); Take 1 tablet (150 mg total) by mouth daily. for anxiety and depression.  Dispense: 90 tablet; Refill: 0 -     Ambulatory referral to Psychiatry  Moderate episode of recurrent major depressive disorder (HCC) Assessment & Plan: Uncontrolled.  As he has failed 3 medications, will refer to psychiatry. He agrees.  Referral placed.   He also agrees to try another medication option.  Start Wellbutrin  XL 150 mg daily. Follow-up in 1 month with us  or with psychiatry, whichever one comes first.  Orders: -     buPROPion  HCl ER (XL); Take 1 tablet (150 mg total) by mouth daily. for anxiety and depression.  Dispense: 90 tablet; Refill: 0 -     Ambulatory referral to Psychiatry    Assessment and Plan Assessment & Plan         Comer MARLA Gaskins, NP

## 2024-01-31 NOTE — Patient Instructions (Signed)
 Start bupropion  (Wellbutrin ) XL 150 mg once daily for depression.  You will receive a phone call from psychiatry regarding an appointment.  Please notify me if you have not heard from someone within 2 weeks.  Schedule a 1 month follow-up visit with me if you have not seen psychiatry.  It was a pleasure to see you today!

## 2024-02-22 ENCOUNTER — Ambulatory Visit (INDEPENDENT_AMBULATORY_CARE_PROVIDER_SITE_OTHER): Admitting: Primary Care

## 2024-02-22 ENCOUNTER — Encounter: Payer: Self-pay | Admitting: Primary Care

## 2024-02-22 ENCOUNTER — Telehealth: Payer: Self-pay

## 2024-02-22 VITALS — BP 148/96 | HR 98 | Temp 97.5°F | Ht 68.0 in | Wt 218.0 lb

## 2024-02-22 DIAGNOSIS — F411 Generalized anxiety disorder: Secondary | ICD-10-CM

## 2024-02-22 DIAGNOSIS — F331 Major depressive disorder, recurrent, moderate: Secondary | ICD-10-CM

## 2024-02-22 DIAGNOSIS — R030 Elevated blood-pressure reading, without diagnosis of hypertension: Secondary | ICD-10-CM

## 2024-02-22 NOTE — Assessment & Plan Note (Signed)
 Improving!  Continue bupropion  XL 150 mg daily. We will check in with psychiatry regarding referral.   Follow up in 3 months.

## 2024-02-22 NOTE — Patient Instructions (Addendum)
 You should receive some contact regarding psychiatry within the next 1 to 2 weeks.  Please notify me if not.  Continue bupropion  XL 150 mg daily.  Please schedule a follow up visit for 3 months.  It was a pleasure to see you today!

## 2024-02-22 NOTE — Telephone Encounter (Signed)
 Please check on status of psychiatry referral  placed on 01/31/24. Patient has not been contacted.

## 2024-02-22 NOTE — Assessment & Plan Note (Signed)
 Above goal again today.  He would like to work on lifestyle changes and monitor at home. Close follow-up in 3 months.

## 2024-02-22 NOTE — Progress Notes (Signed)
 Subjective:    Patient ID: BELL CAI, male    DOB: [birth year], 28 y.o.   MRN: [id number]  Tim Ortiz is a very pleasant 28 y.o. male with a history of GAD, MDD who presents today for follow-up of depression and anxiety and elevated blood pressure reading.   He was last evaluated on 01/31/2024 with symptoms of difficulty concentrating, little motivation to do things, procrastination with work.  He had previously failed Zoloft , Lexapro , Prozac .  During this visit he was referred to psychiatry and also initiated on Wellbutrin  XL 150 mg daily.  He is here for follow-up today.  Since his last visit he has yet to hear from psychiatry regarding referral. He is feeling better. He has increased energy, motivation to exercise, is feeling more concentration, less procrastination. He is exercising daily. He does have SI but has no plan to act on this.   He is not checking his BP at home. He admits to a poor diet over the last several months with fast food. He is working to improve his diet. He denies headaches.   BP Readings from Last 3 Encounters:  02/22/24 (!) 148/96  01/31/24 (!) 142/90  09/21/23 104/78        02/22/2024   12:09 PM 01/31/2024   11:12 AM 08/04/2023   10:04 AM  PHQ9 SCORE ONLY  PHQ-9 Total Score 6 18  0      Data saved with a previous flowsheet row definition      Review of Systems  Eyes:  Negative for visual disturbance.  Respiratory:  Negative for shortness of breath.   Gastrointestinal:  Negative for nausea.  Neurological:  Negative for headaches.  Psychiatric/Behavioral:  The patient is not nervous/anxious.        See HPI.         Past Medical History:  Diagnosis Date   Depression    Diverticulitis     Social History   Socioeconomic History   Marital status: Single    Spouse name: Not on file   Number of children: Not on file   Years of education: Not on file   Highest education level: Not on file  Occupational History   Not on file   Tobacco Use   Smoking status: Former   Smokeless tobacco: Never  Substance and Sexual Activity   Alcohol use: Yes   Drug use: Not on file   Sexual activity: Not on file  Other Topics Concern   Not on file  Social History Narrative   Single.    No children.   [employer] for [employer].   Enjoys playing video games, spending time with friends.    Social Drivers of Corporate investment banker Strain: Not on file  Food Insecurity: Not on file  Transportation Needs: Not on file  Physical Activity: Not on file  Stress: Not on file  Social Connections: Not on file  Intimate Partner Violence: Not on file    History reviewed. No pertinent surgical history.  Family History  Problem Relation Age of Onset   Depression Mother     Allergies  Allergen Reactions   Sertraline  Other (See Comments)    Felt foggy in the head,    Current Outpatient Medications on File Prior to Visit  Medication Sig Dispense Refill   buPROPion  (WELLBUTRIN  XL) 150 MG 24 hr tablet Take 1 tablet (150 mg total) by mouth daily. for anxiety and depression. 90 tablet 0   No current facility-administered medications  on file prior to visit.    BP (!) 148/96   Pulse 98   Temp (!) 97.5 F (36.4 C) (Temporal)   Ht 5' 8 (1.727 m)   Wt 218 lb (98.9 kg)   SpO2 97%   BMI 33.15 kg/m  Objective:   Physical Exam Cardiovascular:     Rate and Rhythm: Normal rate and regular rhythm.  Pulmonary:     Effort: Pulmonary effort is normal.     Breath sounds: Normal breath sounds.  Musculoskeletal:     Cervical back: Neck supple.  Skin:    General: Skin is warm and dry.  Neurological:     Mental Status: He is alert and oriented to person, place, and time.  Psychiatric:        Mood and Affect: Mood normal.     Physical Exam        Assessment & Plan:  Moderate episode of recurrent major depressive disorder [facility]) Assessment & Plan: Improving!  Continue bupropion  XL 150 mg daily. We will check in with  psychiatry regarding referral.   Follow up in 3 months.   GAD (generalized anxiety disorder) Assessment & Plan: Improving!  Continue bupropion  XL 150 mg daily. We will check in with psychiatry regarding referral.   Follow up in 3 months.   Elevated blood pressure reading Assessment & Plan: Above goal again today.  He would like to work on lifestyle changes and monitor at home. Close follow-up in 3 months.     Assessment and Plan Assessment & Plan         Comer MARLA Gaskins, NP    History of Present Illness

## 2024-02-24 NOTE — Telephone Encounter (Signed)
 This was sent to   [facility] at [facility] Address: [address] [place], [place] [address] Phone: [phone]  They do different screenings including ADHD  They are internal and will contact the patient directly to schedule.

## 2024-05-08 ENCOUNTER — Other Ambulatory Visit: Payer: Self-pay | Admitting: Primary Care

## 2024-05-08 DIAGNOSIS — F331 Major depressive disorder, recurrent, moderate: Secondary | ICD-10-CM

## 2024-05-08 DIAGNOSIS — F411 Generalized anxiety disorder: Secondary | ICD-10-CM

## 2024-05-08 NOTE — Telephone Encounter (Signed)
 Copied from CRM [id number]. Topic: Clinical - Medication Refill >> May 08, 2024 12:16 PM Pinkey ORN wrote: Medication: buPROPion  (WELLBUTRIN  XL) 150 MG 24 hr tablet  Has the patient contacted their pharmacy? Yes (Agent: If no, request that the patient contact the pharmacy for the refill. If patient does not wish to contact the pharmacy document the reason why and proceed with request.) (Agent: If yes, when and what did the pharmacy advise?)  This is the patient's preferred pharmacy:  [id number] [place], [place] - [address] [place] [address] Phone: [phone] Fax: [phone]  Is this the correct pharmacy for this prescription? Yes If no, delete pharmacy and type the correct one.   Has the prescription been filled recently? No  Is the patient out of the medication? Yes  Has the patient been seen for an appointment in the last year OR does the patient have an upcoming appointment? Yes  Can we respond through MyChart? No  Agent: Please be advised that Rx refills may take up to 3 business days. We ask that you follow-up with your pharmacy.

## 2024-05-10 MED ORDER — BUPROPION HCL ER (XL) 150 MG PO TB24: 150.0000 mg | ORAL_TABLET | Freq: Every day | ORAL | 0 refills | Status: AC
